# Patient Record
Sex: Male | Born: 1955
Health system: Southern US, Community
[De-identification: ages and names within clinical notes are randomized; demographics above are authoritative.]

## PROBLEM LIST (undated history)

## (undated) DIAGNOSIS — E871 Hypo-osmolality and hyponatremia: Secondary | ICD-10-CM

## (undated) DIAGNOSIS — J309 Allergic rhinitis, unspecified: Secondary | ICD-10-CM

## (undated) DIAGNOSIS — E7800 Pure hypercholesterolemia, unspecified: Secondary | ICD-10-CM

## (undated) DIAGNOSIS — E119 Type 2 diabetes mellitus without complications: Secondary | ICD-10-CM

## (undated) DIAGNOSIS — Z860100 Personal history of colon polyps, unspecified: Secondary | ICD-10-CM

## (undated) DIAGNOSIS — I10 Essential (primary) hypertension: Secondary | ICD-10-CM

## (undated) DIAGNOSIS — H409 Unspecified glaucoma: Secondary | ICD-10-CM

## (undated) DIAGNOSIS — K635 Polyp of colon: Secondary | ICD-10-CM

## (undated) DIAGNOSIS — C4491 Basal cell carcinoma of skin, unspecified: Secondary | ICD-10-CM

## (undated) DIAGNOSIS — Z9289 Personal history of other medical treatment: Secondary | ICD-10-CM

## (undated) DIAGNOSIS — R55 Syncope and collapse: Secondary | ICD-10-CM

## (undated) HISTORY — DX: Polyp of colon: K63.5

## (undated) HISTORY — DX: Allergic rhinitis, unspecified: J30.9

## (undated) HISTORY — DX: Personal history of colonic polyps: Z86.010

## (undated) HISTORY — PX: EYE SURGERY: SHX253

## (undated) HISTORY — PX: COLONOSCOPY: SHX174

## (undated) HISTORY — DX: Syncope and collapse: R55

## (undated) HISTORY — PX: TOTAL HIP ARTHROPLASTY: SHX124

## (undated) HISTORY — DX: Personal history of other medical treatment: Z92.89

## (undated) HISTORY — PX: ORIF WRIST FRACTURE: SHX2133

## (undated) HISTORY — DX: Personal history of colon polyps, unspecified: Z86.0100

## (undated) HISTORY — DX: Hypo-osmolality and hyponatremia: E87.1

## (undated) HISTORY — PX: HERNIA REPAIR: SHX51

---

## 1999-04-23 ENCOUNTER — Encounter: Admission: RE | Admit: 1999-04-23 | Discharge: 1999-07-22 | Payer: Self-pay | Admitting: Family Medicine

## 2003-05-11 ENCOUNTER — Encounter: Payer: Self-pay | Admitting: Orthopedic Surgery

## 2003-05-11 ENCOUNTER — Encounter: Admission: RE | Admit: 2003-05-11 | Discharge: 2003-05-11 | Payer: Self-pay | Admitting: Orthopedic Surgery

## 2004-10-08 ENCOUNTER — Encounter: Admission: RE | Admit: 2004-10-08 | Discharge: 2004-10-08 | Payer: Self-pay | Admitting: Orthopedic Surgery

## 2005-03-29 ENCOUNTER — Emergency Department (HOSPITAL_COMMUNITY): Admission: EM | Admit: 2005-03-29 | Discharge: 2005-03-29 | Payer: Self-pay | Admitting: Emergency Medicine

## 2005-09-13 ENCOUNTER — Emergency Department (HOSPITAL_COMMUNITY): Admission: EM | Admit: 2005-09-13 | Discharge: 2005-09-13 | Payer: Self-pay | Admitting: Emergency Medicine

## 2005-12-16 IMAGING — CR DG PELVIS 1-2V
1 series · 1 of 1 positions shown · non-contrast
Comparison: none

CLINICAL DATA: 48-year-old, left hip pain.  
 LEFT HIP ? 2 VIEW: 
 The left femoral prosthesis is dislocated superiorly.  There is marked lucency in the acetabulum and around the femoral prosthesis which may be secondary to particle disease.

[view not recorded]
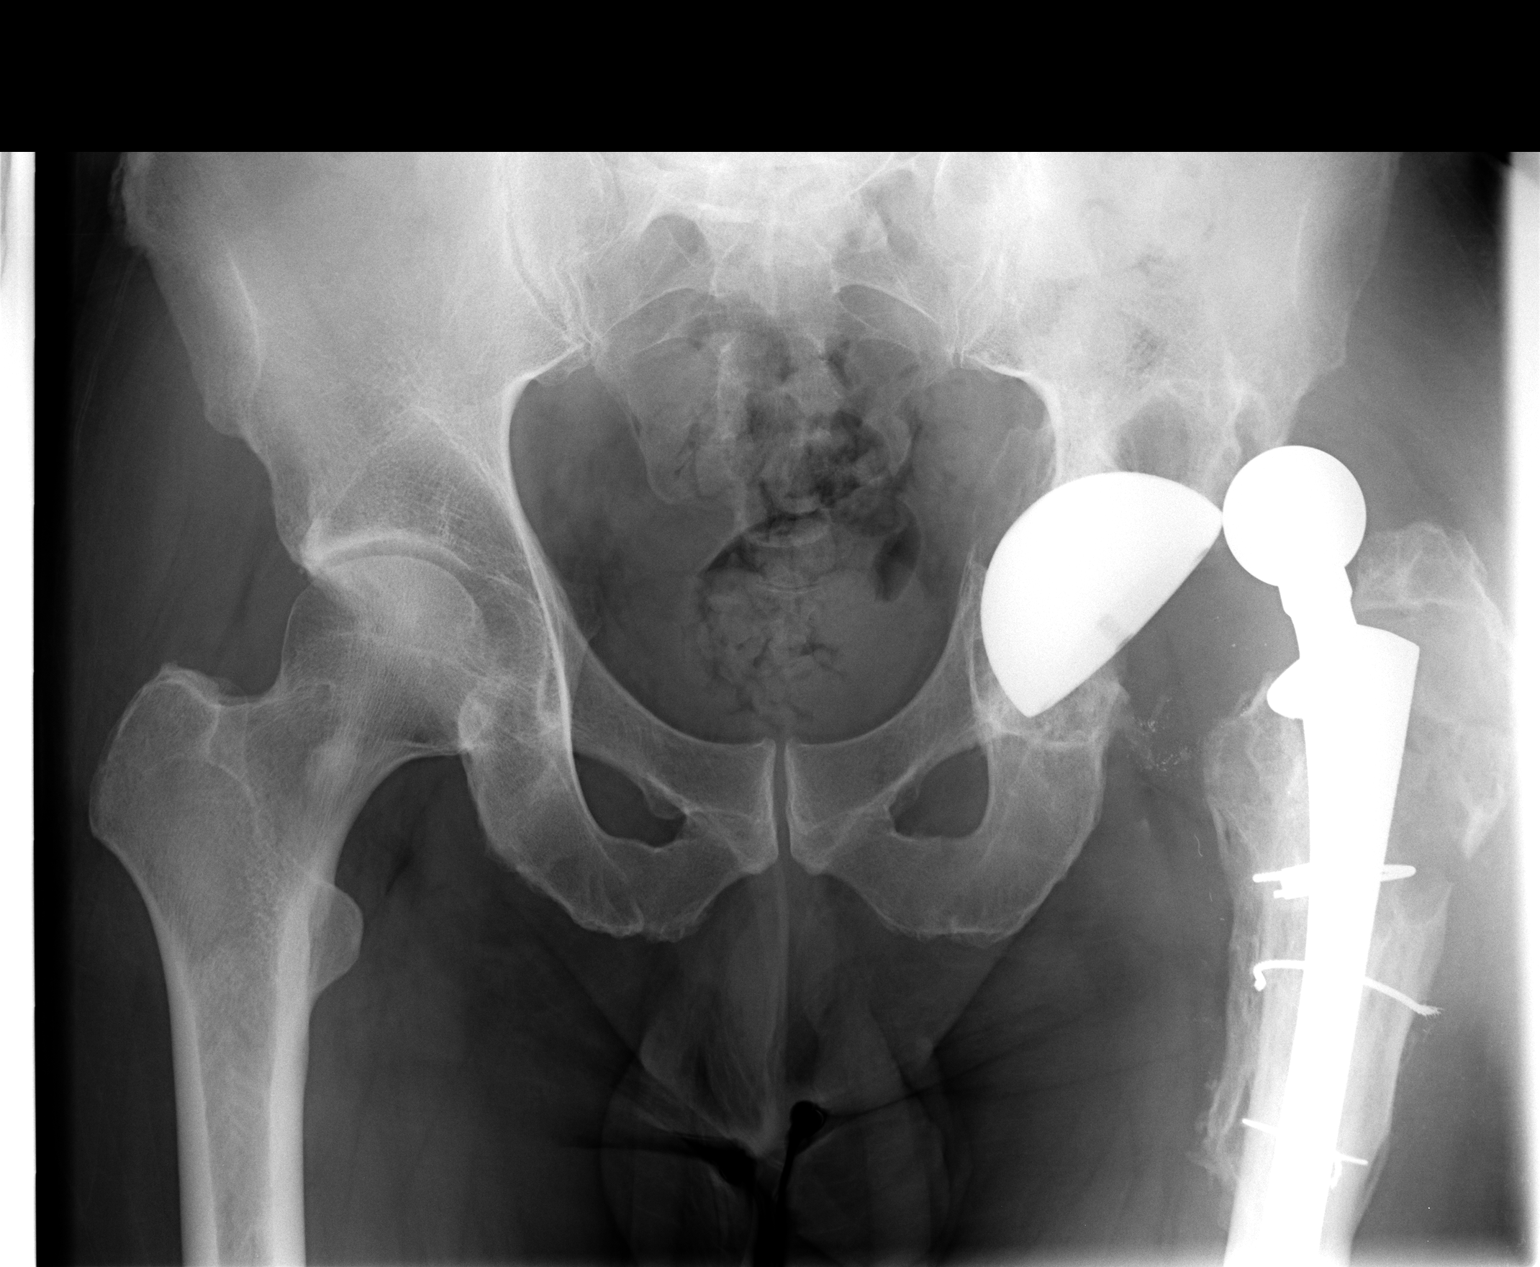

[1 of 1 positions shown; findings below may reference images not displayed]

IMPRESSION: Dislocated left femoral prosthesis. 
 AP PELVIS:
 Single AP view of the pelvis demonstrates dislocated left femoral prosthesis.  There is marked lucency involving the iliac bone around the acetabular prosthesis which may be due to advanced particle disease.  The femur looks similar in appearance.
IMPRESSION: Dislocated left femoral prosthesis.

## 2005-12-16 IMAGING — CR DG HIP (WITH OR WITHOUT PELVIS) 2-3V*L*
2 series · 2 of 2 positions shown · non-contrast
Comparison: none

CLINICAL DATA: 48-year-old, left hip pain.  
 LEFT HIP ? 2 VIEW: 
 The left femoral prosthesis is dislocated superiorly.  There is marked lucency in the acetabulum and around the femoral prosthesis which may be secondary to particle disease.

[view not recorded (1 of 2)]
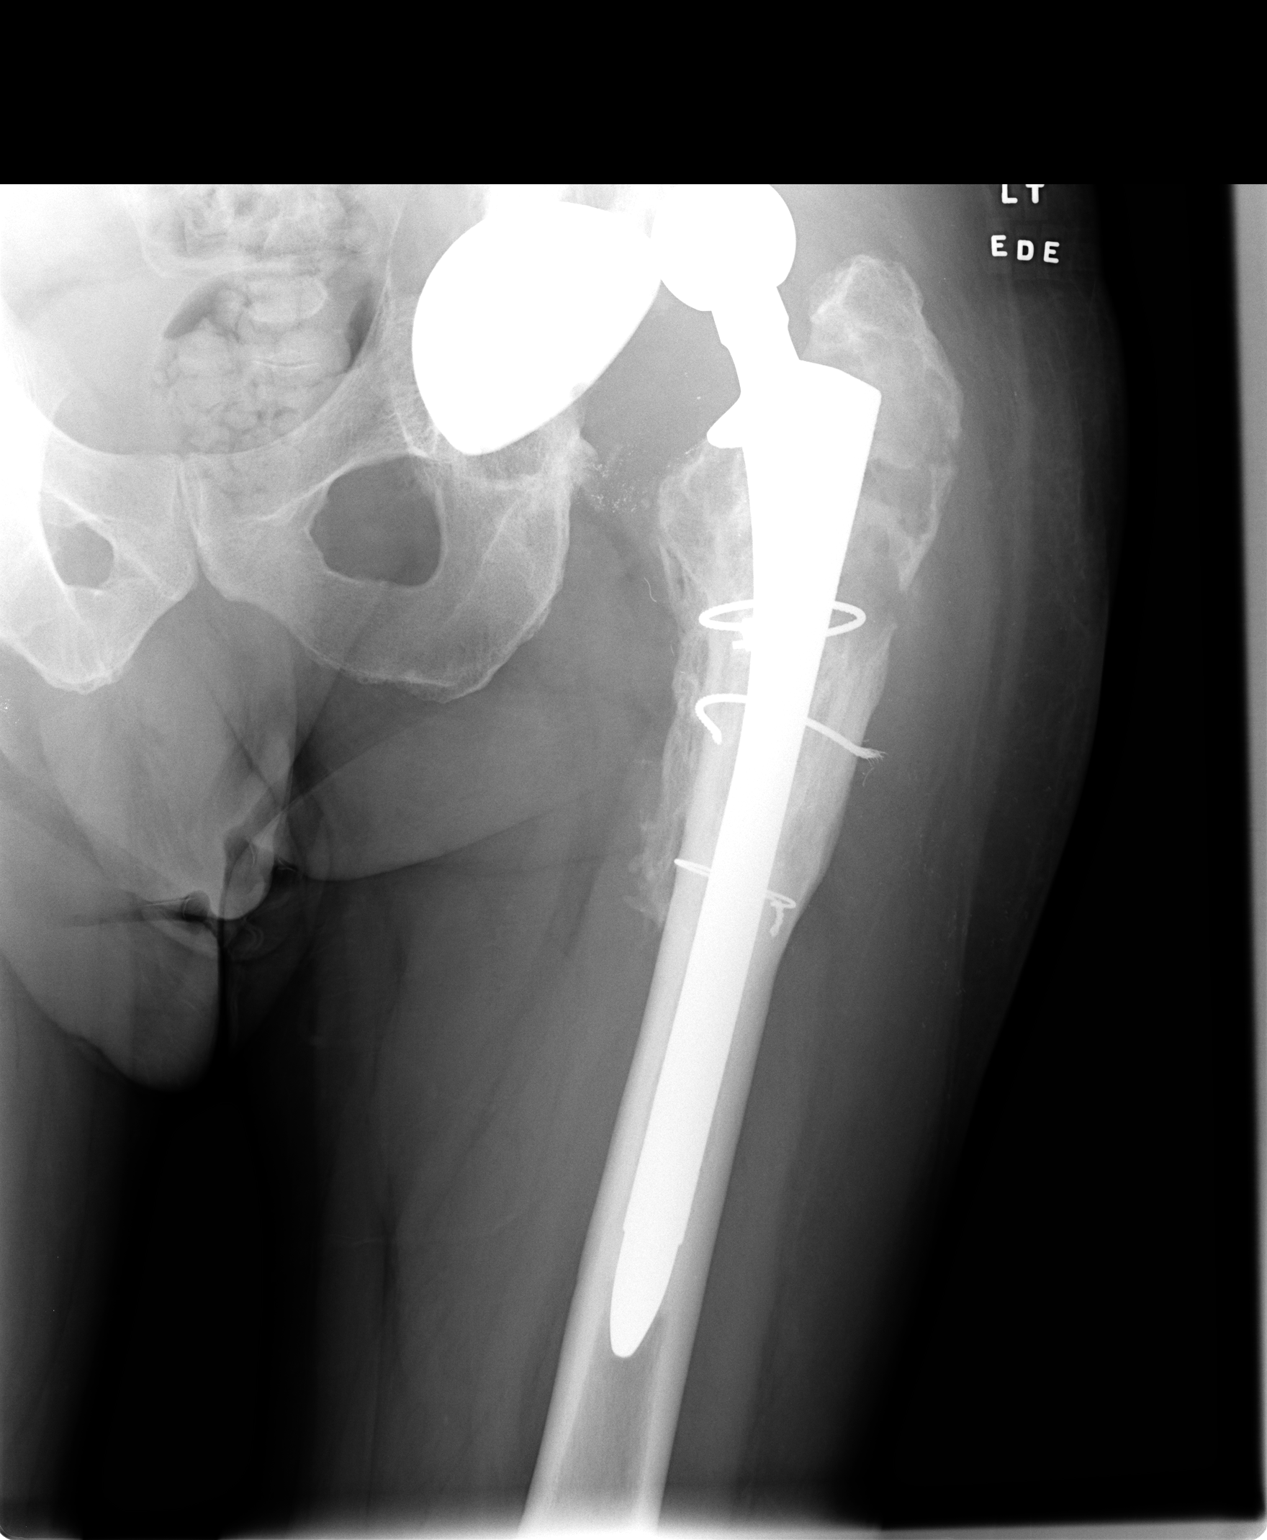

[view not recorded (2 of 2)]
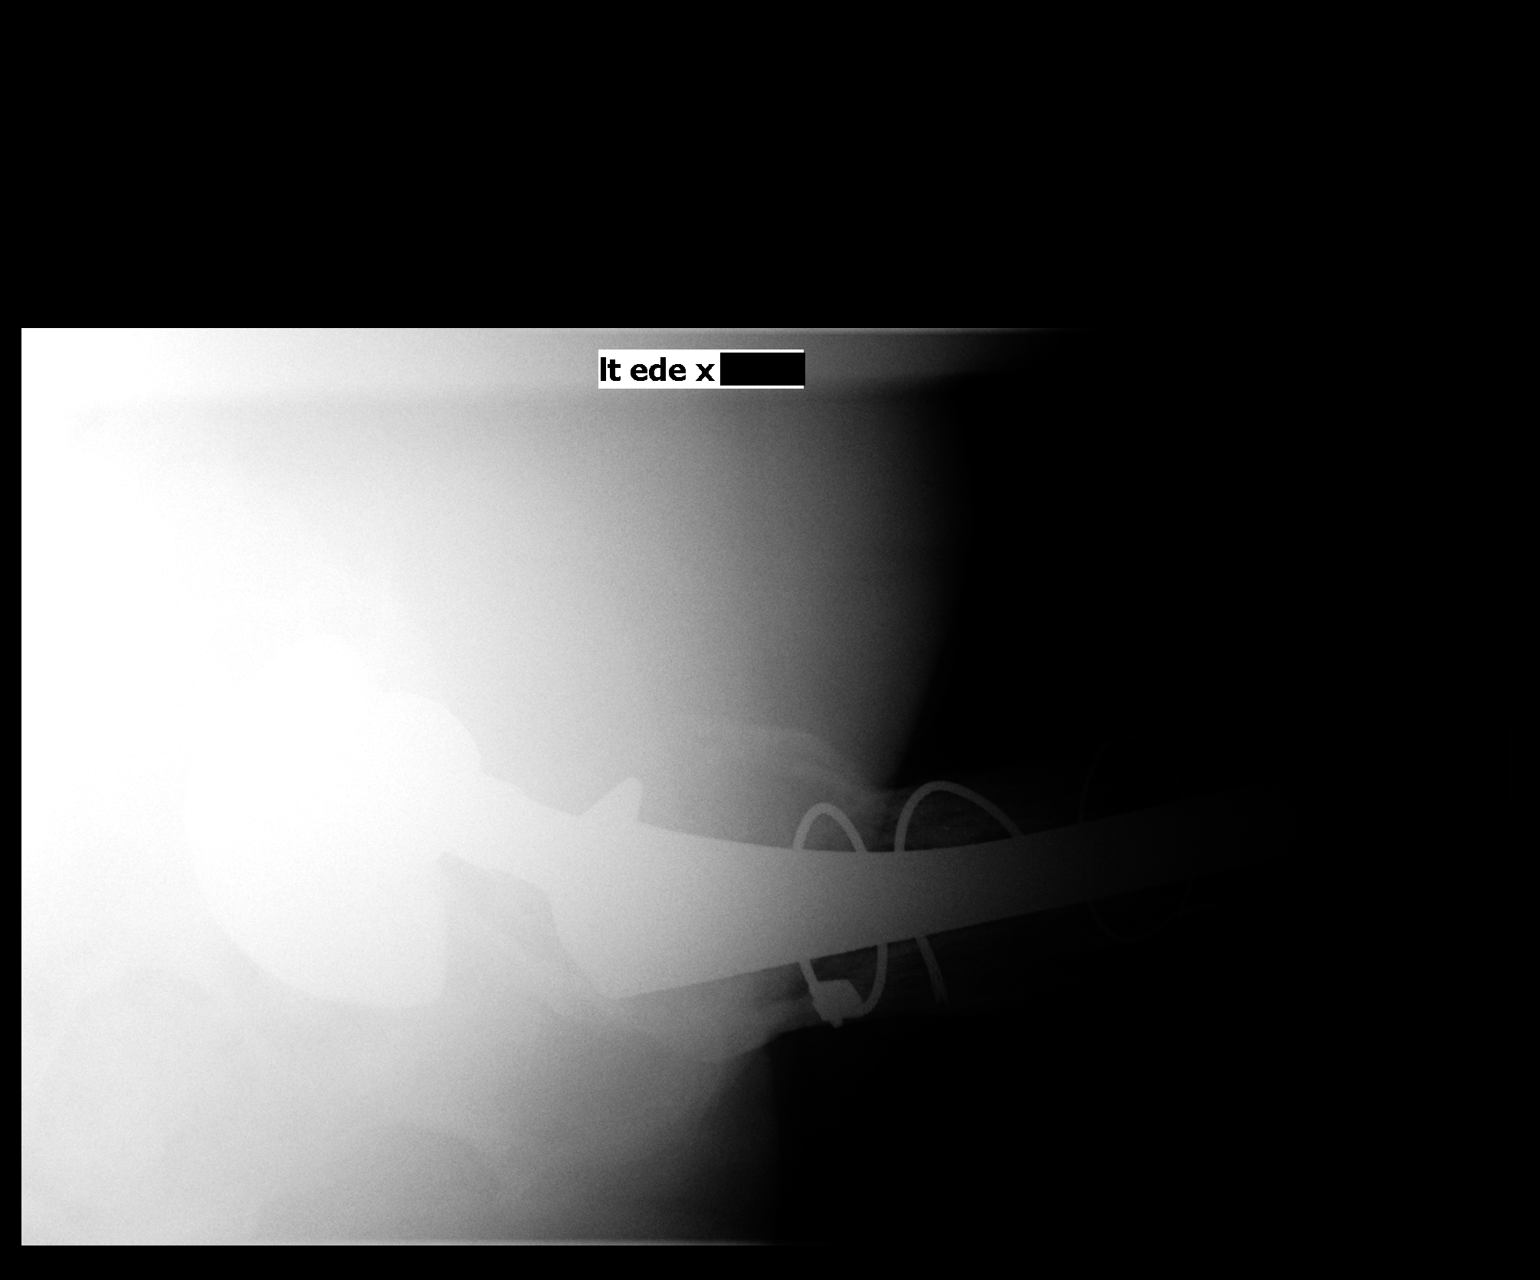

[2 of 2 positions shown; findings below may reference images not displayed]

IMPRESSION: Dislocated left femoral prosthesis. 
 AP PELVIS:
 Single AP view of the pelvis demonstrates dislocated left femoral prosthesis.  There is marked lucency involving the iliac bone around the acetabular prosthesis which may be due to advanced particle disease.  The femur looks similar in appearance.
IMPRESSION: Dislocated left femoral prosthesis.

## 2006-06-02 IMAGING — RF DG HIP OPERATIVE*L*
1 series · 1 of 1 positions shown · non-contrast
Comparison: Earlier today.

CLINICAL DATA: Closed reduction of a left hip dislocation.

LEFT HIP - SINGLE AP VIEW:

[Series 1: run · 1 of 1 slices shown]
[im 1/1]
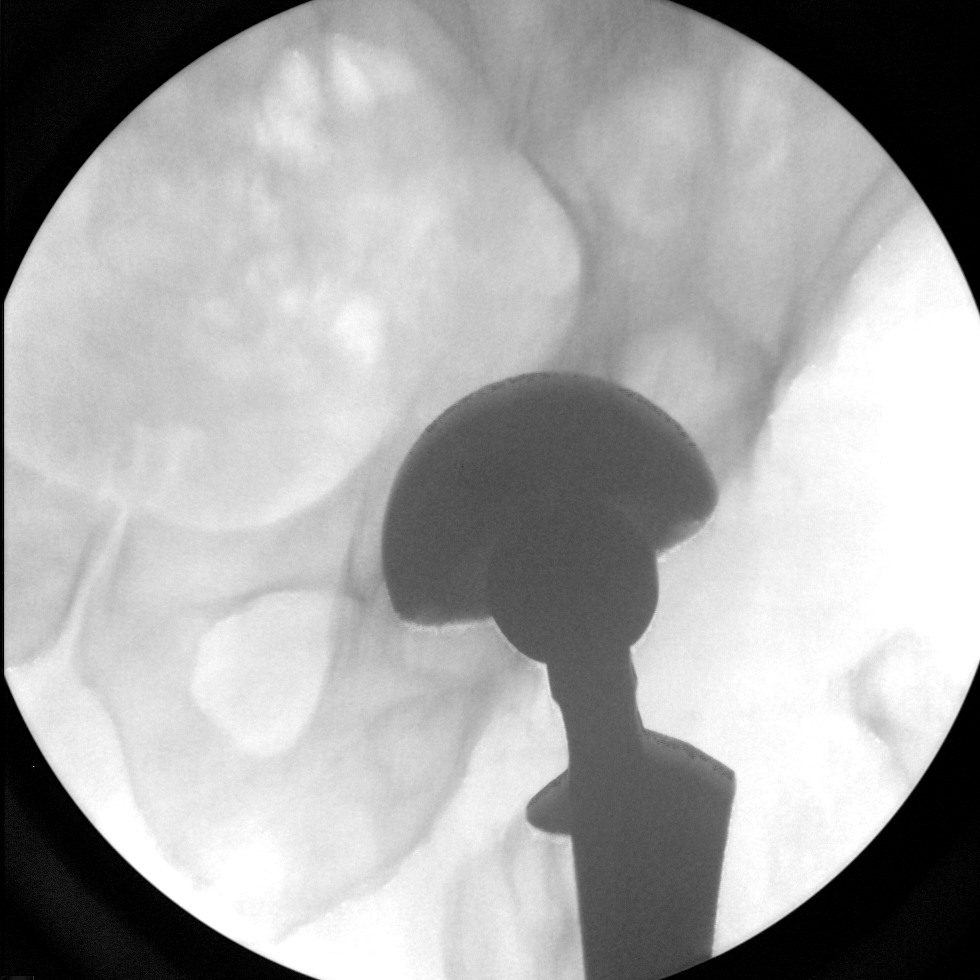

[1 of 1 positions shown; findings below may reference images not displayed]

FINDINGS: The previously demonstrated dislocated femoral head component of the
left total hip prosthesis has been relocated in the AP projection. Previously
noted periprosthetic lucency and acetabular discontinuity.
IMPRESSION: Relocated prosthesis. Stable findings of cortical disease and stable acetabular
discontinuity.

## 2013-08-11 ENCOUNTER — Other Ambulatory Visit: Payer: Self-pay | Admitting: Gastroenterology

## 2013-08-11 ENCOUNTER — Encounter (HOSPITAL_COMMUNITY)
Admission: RE | Disposition: A | Discharge status: Home / Self Care | Payer: Self-pay | Source: Ambulatory Visit | Attending: Gastroenterology

## 2013-08-11 ENCOUNTER — Ambulatory Visit (HOSPITAL_COMMUNITY)
Admission: RE | Admit: 2013-08-11 | Discharge: 2013-08-11 | Disposition: A | Discharge status: Home / Self Care | Payer: BC Managed Care – PPO | Source: Ambulatory Visit | Attending: Gastroenterology | Admitting: Gastroenterology

## 2013-08-11 ENCOUNTER — Encounter (HOSPITAL_COMMUNITY): Payer: Self-pay | Admitting: *Deleted

## 2013-08-11 DIAGNOSIS — D126 Benign neoplasm of colon, unspecified: Secondary | ICD-10-CM | POA: Diagnosis present

## 2013-08-11 DIAGNOSIS — K648 Other hemorrhoids: Secondary | ICD-10-CM | POA: Insufficient documentation

## 2013-08-11 DIAGNOSIS — D128 Benign neoplasm of rectum: Secondary | ICD-10-CM | POA: Insufficient documentation

## 2013-08-11 DIAGNOSIS — K635 Polyp of colon: Secondary | ICD-10-CM

## 2013-08-11 HISTORY — DX: Type 2 diabetes mellitus without complications: E11.9

## 2013-08-11 HISTORY — DX: Pure hypercholesterolemia, unspecified: E78.00

## 2013-08-11 HISTORY — PX: COLONOSCOPY: SHX5424

## 2013-08-11 HISTORY — PX: HOT HEMOSTASIS: SHX5433

## 2013-08-11 HISTORY — DX: Polyp of colon: K63.5

## 2013-08-11 HISTORY — DX: Essential (primary) hypertension: I10

## 2013-08-11 HISTORY — DX: Basal cell carcinoma of skin, unspecified: C44.91

## 2013-08-11 HISTORY — DX: Unspecified glaucoma: H40.9

## 2013-08-11 LAB — GLUCOSE, CAPILLARY: Glucose-Capillary: 109 mg/dL — ABNORMAL HIGH (ref 70–99)

## 2013-08-11 SURGERY — COLONOSCOPY
Anesthesia: Moderate Sedation

## 2013-08-11 MED ORDER — DIPHENHYDRAMINE HCL 50 MG/ML IJ SOLN
INTRAMUSCULAR | Status: DC | PRN
  Administered 2013-08-11: 25 mg via INTRAVENOUS

## 2013-08-11 MED ORDER — FENTANYL CITRATE 0.05 MG/ML IJ SOLN
INTRAMUSCULAR | Status: AC
  Filled 2013-08-11: qty 4

## 2013-08-11 MED ORDER — SODIUM CHLORIDE 0.9 % IV SOLN: INTRAVENOUS | Status: DC

## 2013-08-11 MED ORDER — FENTANYL CITRATE 0.05 MG/ML IJ SOLN
INTRAMUSCULAR | Status: DC | PRN
  Administered 2013-08-11 (×3): 25 ug via INTRAVENOUS

## 2013-08-11 MED ORDER — MIDAZOLAM HCL 5 MG/5ML IJ SOLN
INTRAMUSCULAR | Status: DC | PRN
  Administered 2013-08-11 (×4): 2.5 mg via INTRAVENOUS

## 2013-08-11 MED ORDER — SODIUM CHLORIDE 0.9 % IV SOLN
INTRAVENOUS | Status: DC
  Administered 2013-08-11: 500 mL via INTRAVENOUS

## 2013-08-11 MED ORDER — DIPHENHYDRAMINE HCL 50 MG/ML IJ SOLN
INTRAMUSCULAR | Status: AC
  Filled 2013-08-11: qty 1

## 2013-08-11 MED ORDER — MIDAZOLAM HCL 10 MG/2ML IJ SOLN
INTRAMUSCULAR | Status: AC
  Filled 2013-08-11: qty 4

## 2013-08-11 NOTE — Addendum Note (Signed)
Addended by: Charlott Rakes on: 08/11/2013 01:16 PM   Modules accepted: Orders

## 2013-08-11 NOTE — Op Note (Signed)
[facility] [address] [place], [address]   COLONOSCOPY PROCEDURE REPORT  PATIENT: Zachary Phelps, Zachary Phelps  MR#: [id number] BIRTHDATE: 11-27-1955 , 57  yrs. old GENDER: Male ENDOSCOPIST: Charlott Rakes, MD REFERRED BY: PROCEDURE DATE:  08/11/2013 PROCEDURE:   Colonoscopy with snare polypectomy ASA CLASS:   Class II INDICATIONS:follow up of adenomatous colonic polyp(s). MEDICATIONS: Fentanyl 100 mcg IV, Versed 10 mg IV, and Diphenhydramine (Benadryl) 25 mg IV  DESCRIPTION OF PROCEDURE:   After the risks benefits and alternatives of the procedure were thoroughly explained, informed consent was obtained.  The     endoscope was introduced through the anus and advanced to the cecum, which was identified by both the appendix and ileocecal valve , limited by No adverse events experienced.   The quality of the prep was good. .  The instrument was then slowly withdrawn as the colon was fully examined.     FINDINGS:  Rectal exam unremarkable.  Pediatric colonoscope inserted into the colon and advanced to the cecum, where the appendiceal orifice and ileocecal valve were identified.    On careful withdrawal of the colonoscope the previously tattooed area was noted in the ascending colon and residual polyp was seen. Polyp was removed with snare cautery and no bleeding seen at the polypectomy site. Due to the size of the polypectomy site 2 hemoclips were used to close the mucosal folds together at the polypectomy site. Four polyps were seen in the sigmoid colon, rectosigmoid colon, and rectum and they were removed with cold biopsy forceps. A 4 mm sessile polyp was removed with snare cautery in the rectosigmoid area but was not able to be retrieved. Retroflexion revealed small internal hemorrhoids.  COMPLICATIONS: None  IMPRESSION:     1. Large ascending colon polyp removed at previously tattooed site consistent with polyp that was noted to be a serrated adenoma on  previous procedure 2. Four colon polyps distally removed as above 3. Small polyp removed in rectosigmoid area that could not be retrieved 4. Small internal hemorrhoids  RECOMMENDATIONS: F/U on path; No aspirin products for 2 weeks    ______________________________ eSignedCharlott Rakes, MD 08/11/2013 2:44 PM   CC:  PATIENT NAME:  Rider, Ermis MR#: [id number]

## 2013-08-11 NOTE — H&P (Signed)
  Date of Initial H&P: 08/06/13  History reviewed, patient examined, no change in status, stable for surgery.

## 2013-08-11 NOTE — Interval H&P Note (Signed)
History and Physical Interval Note:  08/11/2013 1:50 PM  Zachary Phelps  has presented today for surgery, with the diagnosis of polyp  The various methods of treatment have been discussed with the patient and family. After consideration of risks, benefits and other options for treatment, the patient has consented to  Procedure(s): COLONOSCOPY (N/A) HOT HEMOSTASIS (ARGON PLASMA COAGULATION/BICAP) (N/A) as a surgical intervention .  The patient's history has been reviewed, patient examined, no change in status, stable for surgery.  I have reviewed the patient's chart and labs.  Questions were answered to the patient's satisfaction.     Iasia Forcier C.

## 2013-08-12 ENCOUNTER — Encounter (HOSPITAL_COMMUNITY): Payer: Self-pay | Admitting: Gastroenterology

## 2017-01-05 ENCOUNTER — Emergency Department (HOSPITAL_COMMUNITY): Payer: No Typology Code available for payment source

## 2017-01-05 ENCOUNTER — Emergency Department (HOSPITAL_COMMUNITY)
Admission: EM | Admit: 2017-01-05 | Discharge: 2017-01-05 | Disposition: A | Discharge status: Home / Self Care | Payer: No Typology Code available for payment source | Attending: Emergency Medicine | Admitting: Emergency Medicine

## 2017-01-05 DIAGNOSIS — Y92410 Unspecified street and highway as the place of occurrence of the external cause: Secondary | ICD-10-CM | POA: Diagnosis not present

## 2017-01-05 DIAGNOSIS — Z85828 Personal history of other malignant neoplasm of skin: Secondary | ICD-10-CM | POA: Diagnosis not present

## 2017-01-05 DIAGNOSIS — I10 Essential (primary) hypertension: Secondary | ICD-10-CM | POA: Diagnosis not present

## 2017-01-05 DIAGNOSIS — Y999 Unspecified external cause status: Secondary | ICD-10-CM | POA: Insufficient documentation

## 2017-01-05 DIAGNOSIS — E119 Type 2 diabetes mellitus without complications: Secondary | ICD-10-CM | POA: Diagnosis not present

## 2017-01-05 DIAGNOSIS — S199XXA Unspecified injury of neck, initial encounter: Secondary | ICD-10-CM | POA: Diagnosis present

## 2017-01-05 DIAGNOSIS — Z85038 Personal history of other malignant neoplasm of large intestine: Secondary | ICD-10-CM | POA: Insufficient documentation

## 2017-01-05 DIAGNOSIS — S161XXA Strain of muscle, fascia and tendon at neck level, initial encounter: Secondary | ICD-10-CM

## 2017-01-05 DIAGNOSIS — Y939 Activity, unspecified: Secondary | ICD-10-CM | POA: Diagnosis not present

## 2017-01-05 DIAGNOSIS — Z79899 Other long term (current) drug therapy: Secondary | ICD-10-CM | POA: Insufficient documentation

## 2017-01-05 DIAGNOSIS — Z7982 Long term (current) use of aspirin: Secondary | ICD-10-CM | POA: Insufficient documentation

## 2017-01-05 DIAGNOSIS — Z96642 Presence of left artificial hip joint: Secondary | ICD-10-CM | POA: Diagnosis not present

## 2017-01-05 MED ORDER — METHOCARBAMOL 750 MG PO TABS: 750.0000 mg | ORAL_TABLET | Freq: Four times a day (QID) | ORAL | 0 refills | Status: DC

## 2017-01-05 MED ORDER — ONDANSETRON 4 MG PO TBDP
4.0000 mg | ORAL_TABLET | Freq: Once | ORAL | Status: AC
  Administered 2017-01-05: 4 mg via ORAL
  Filled 2017-01-05: qty 1

## 2017-01-05 MED ORDER — ACETAMINOPHEN 325 MG PO TABS
650.0000 mg | ORAL_TABLET | Freq: Once | ORAL | Status: AC
  Administered 2017-01-05: 650 mg via ORAL
  Filled 2017-01-05: qty 2
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
## 2017-01-05 NOTE — ED Notes (Signed)
ED Provider at bedside. 

## 2017-06-12 ENCOUNTER — Observation Stay (HOSPITAL_BASED_OUTPATIENT_CLINIC_OR_DEPARTMENT_OTHER): Payer: Self-pay

## 2017-06-12 ENCOUNTER — Encounter (HOSPITAL_COMMUNITY): Payer: Self-pay

## 2017-06-12 ENCOUNTER — Observation Stay (HOSPITAL_COMMUNITY)
Admission: EM | Admit: 2017-06-12 | Discharge: 2017-06-13 | Disposition: A | Discharge status: Home / Self Care | Payer: Self-pay | Attending: Internal Medicine | Admitting: Internal Medicine

## 2017-06-12 DIAGNOSIS — E119 Type 2 diabetes mellitus without complications: Secondary | ICD-10-CM | POA: Insufficient documentation

## 2017-06-12 DIAGNOSIS — R531 Weakness: Secondary | ICD-10-CM

## 2017-06-12 DIAGNOSIS — E871 Hypo-osmolality and hyponatremia: Secondary | ICD-10-CM | POA: Insufficient documentation

## 2017-06-12 DIAGNOSIS — I10 Essential (primary) hypertension: Secondary | ICD-10-CM | POA: Diagnosis present

## 2017-06-12 DIAGNOSIS — R55 Syncope and collapse: Principal | ICD-10-CM | POA: Diagnosis present

## 2017-06-12 DIAGNOSIS — H409 Unspecified glaucoma: Secondary | ICD-10-CM | POA: Diagnosis present

## 2017-06-12 DIAGNOSIS — Z7982 Long term (current) use of aspirin: Secondary | ICD-10-CM | POA: Insufficient documentation

## 2017-06-12 DIAGNOSIS — Z794 Long term (current) use of insulin: Secondary | ICD-10-CM | POA: Insufficient documentation

## 2017-06-12 DIAGNOSIS — Z79899 Other long term (current) drug therapy: Secondary | ICD-10-CM | POA: Insufficient documentation

## 2017-06-12 DIAGNOSIS — M791 Myalgia: Secondary | ICD-10-CM | POA: Insufficient documentation

## 2017-06-12 DIAGNOSIS — E7800 Pure hypercholesterolemia, unspecified: Secondary | ICD-10-CM | POA: Insufficient documentation

## 2017-06-12 DIAGNOSIS — Z85828 Personal history of other malignant neoplasm of skin: Secondary | ICD-10-CM | POA: Insufficient documentation

## 2017-06-12 HISTORY — DX: Syncope and collapse: R55

## 2017-06-12 LAB — URINALYSIS, ROUTINE W REFLEX MICROSCOPIC
BILIRUBIN URINE: NEGATIVE
Glucose, UA: >=500 mg/dL — AB
Hgb urine dipstick: NEGATIVE
KETONES UR: 20 mg/dL — AB
Leukocytes, UA: NEGATIVE
Nitrite: NEGATIVE
PROTEIN: 100 mg/dL — AB
SQUAMOUS EPITHELIAL / LPF: NONE SEEN
Specific Gravity, Urine: 1.024 (ref 1.005–1.030)
pH: 5 (ref 5.0–8.0)

## 2017-06-12 LAB — BASIC METABOLIC PANEL
Anion gap: 10 (ref 5–15)
BUN: 12 mg/dL (ref 6–20)
CO2: 25 mmol/L (ref 22–32)
CREATININE: 0.68 mg/dL (ref 0.61–1.24)
Calcium: 9.1 mg/dL (ref 8.9–10.3)
Chloride: 96 mmol/L — ABNORMAL LOW (ref 101–111)
GFR calc Af Amer: >60 mL/min (ref >60)
GFR calc non Af Amer: >60 mL/min (ref >60)
Glucose, Bld: 218 mg/dL — ABNORMAL HIGH (ref 65–99)
Potassium: 3.8 mmol/L (ref 3.5–5.1)
Sodium: 131 mmol/L — ABNORMAL LOW (ref 135–145)

## 2017-06-12 LAB — MAGNESIUM: Magnesium: 1.5 mg/dL — ABNORMAL LOW (ref 1.7–2.4)

## 2017-06-12 LAB — CBC
HCT: 39.7 % (ref 39.0–52.0)
Hemoglobin: 14 g/dL (ref 13.0–17.0)
MCH: 30.4 pg (ref 26.0–34.0)
MCHC: 35.3 g/dL (ref 30.0–36.0)
MCV: 86.1 fL (ref 78.0–100.0)
PLATELETS: 204 10*3/uL (ref 150–400)
RBC: 4.61 MIL/uL (ref 4.22–5.81)
RDW: 13.2 % (ref 11.5–15.5)
WBC: 10.1 10*3/uL (ref 4.0–10.5)

## 2017-06-12 LAB — GLUCOSE, CAPILLARY
GLUCOSE-CAPILLARY: 216 mg/dL — AB (ref 65–99)
Glucose-Capillary: 174 mg/dL — ABNORMAL HIGH (ref 65–99)

## 2017-06-12 LAB — TROPONIN I

## 2017-06-12 LAB — ECHOCARDIOGRAM COMPLETE
HEIGHTINCHES: 70 in
WEIGHTICAEL: 2960 [oz_av]

## 2017-06-12 LAB — PHOSPHORUS: PHOSPHORUS: 2.6 mg/dL (ref 2.5–4.6)

## 2017-06-12 MED ORDER — TIMOLOL MALEATE 0.5 % OP SOLN
1.0000 [drp] | Freq: Two times a day (BID) | OPHTHALMIC | Status: DC
  Administered 2017-06-12 – 2017-06-13 (×2): 1 [drp] via OPHTHALMIC
  Filled 2017-06-12: qty 5

## 2017-06-12 MED ORDER — BRIMONIDINE TARTRATE 0.2 % OP SOLN
1.0000 [drp] | Freq: Two times a day (BID) | OPHTHALMIC | Status: DC
  Administered 2017-06-12 – 2017-06-13 (×2): 1 [drp] via OPHTHALMIC
  Filled 2017-06-12: qty 5

## 2017-06-12 MED ORDER — SODIUM CHLORIDE 0.9 % IV SOLN
INTRAVENOUS | Status: AC
  Administered 2017-06-12 – 2017-06-13 (×2): via INTRAVENOUS

## 2017-06-12 MED ORDER — HYDROCHLOROTHIAZIDE 25 MG PO TABS
25.0000 mg | ORAL_TABLET | Freq: Every day | ORAL | Status: DC
  Administered 2017-06-12 – 2017-06-13 (×2): 25 mg via ORAL
  Filled 2017-06-12 (×2): qty 1

## 2017-06-12 MED ORDER — SODIUM CHLORIDE 0.9% FLUSH
3.0000 mL | Freq: Two times a day (BID) | INTRAVENOUS | Status: DC
  Administered 2017-06-13: 3 mL via INTRAVENOUS

## 2017-06-12 MED ORDER — INSULIN ASPART 100 UNIT/ML ~~LOC~~ SOLN
0.0000 [IU] | Freq: Three times a day (TID) | SUBCUTANEOUS | Status: DC
  Administered 2017-06-12: 18:00:00 5 [IU] via SUBCUTANEOUS
  Administered 2017-06-13: 10:00:00 3 [IU] via SUBCUTANEOUS

## 2017-06-12 MED ORDER — ACETAMINOPHEN 325 MG PO TABS: 650.0000 mg | ORAL_TABLET | Freq: Four times a day (QID) | ORAL | Status: DC | PRN

## 2017-06-12 MED ORDER — HYDRALAZINE HCL 20 MG/ML IJ SOLN: 10.0000 mg | Freq: Three times a day (TID) | INTRAMUSCULAR | Status: DC | PRN

## 2017-06-12 MED ORDER — AMLODIPINE BESYLATE 2.5 MG PO TABS
2.5000 mg | ORAL_TABLET | Freq: Every day | ORAL | Status: DC
  Administered 2017-06-13: 2.5 mg via ORAL
  Filled 2017-06-12: qty 1

## 2017-06-12 MED ORDER — BRIMONIDINE TARTRATE-TIMOLOL 0.2-0.5 % OP SOLN
1.0000 [drp] | Freq: Two times a day (BID) | OPHTHALMIC | Status: DC
  Filled 2017-06-12: qty 5

## 2017-06-12 MED ORDER — ASPIRIN EC 81 MG PO TBEC
81.0000 mg | DELAYED_RELEASE_TABLET | Freq: Every day | ORAL | Status: DC
  Administered 2017-06-12 – 2017-06-13 (×2): 81 mg via ORAL
  Filled 2017-06-12 (×2): qty 1

## 2017-06-12 MED ORDER — SODIUM CHLORIDE 0.9 % IV BOLUS (SEPSIS)
500.0000 mL | Freq: Once | INTRAVENOUS | Status: AC
  Administered 2017-06-12: 500 mL via INTRAVENOUS

## 2017-06-12 MED ORDER — LISINOPRIL 20 MG PO TABS
20.0000 mg | ORAL_TABLET | Freq: Every day | ORAL | Status: DC
  Administered 2017-06-12 – 2017-06-13 (×2): 20 mg via ORAL
  Filled 2017-06-12 (×2): qty 1

## 2017-06-12 MED ORDER — ATORVASTATIN CALCIUM 10 MG PO TABS
10.0000 mg | ORAL_TABLET | ORAL | Status: DC
  Administered 2017-06-13: 10 mg via ORAL
  Filled 2017-06-12: qty 1

## 2017-06-12 MED ORDER — LISINOPRIL-HYDROCHLOROTHIAZIDE 20-25 MG PO TABS: 1.0000 | ORAL_TABLET | Freq: Every day | ORAL | Status: DC

## 2017-06-12 MED ORDER — LATANOPROST 0.005 % OP SOLN
1.0000 [drp] | Freq: Every day | OPHTHALMIC | Status: DC
  Administered 2017-06-12: 1 [drp] via OPHTHALMIC
  Filled 2017-06-12: qty 2.5

## 2017-06-12 MED ORDER — ONDANSETRON HCL 4 MG/2ML IJ SOLN: 4.0000 mg | Freq: Four times a day (QID) | INTRAMUSCULAR | Status: DC | PRN

## 2017-06-12 NOTE — ED Provider Notes (Signed)
[facility] DEPT Provider Note   CSN: [id number] Arrival date & time: 06/12/17  0904   History   Chief Complaint Chief Complaint  Patient presents with  . Loss of Consciousness    HPI Zachary Phelps is a 61 y.o. male.  Presented with acute onset syncope while sitting in a barber chair today. Patient has a PMH significant for HTN, DMII, HLP, colonic polyps s/p removal, hernia repair and hip replacement as well as chronic lower back pain. He awoke this morning as usual, did not feel different than any other day. Had coffee with a little cream and went to the barber. There, while sitting in the chair he began to feel warm, started sweating and a" little unusual". The next thing he recalls, the barber is telling him that he called EMS as he was passed out for a few brief moments, patient is unaware of the exact length of time he was uncounsious. He states that since that time he feels weak, but denied additional symptoms such as SOB, chest pain, back pain, headache, visual disturbances etc. He denied muscle tremor or shaking, loss of bowel or bladder control, cardiac palpitations, pain, positional changes. He stated that he was sitting upright in the chair, neck was in a neutral position, not strained or rotated to one side or the other.       Past Medical History:  Diagnosis Date  . Basal cell carcinoma   . Diabetes mellitus without complication ([place])   . Glaucoma   . Hypercholesteremia   . Hypertension     Patient Active Problem List   Diagnosis Date Noted  . Benign neoplasm of colon 08/11/2013    Past Surgical History:  Procedure Laterality Date  . COLONOSCOPY    . COLONOSCOPY N/A 08/11/2013   Procedure: COLONOSCOPY;  Surgeon: Lear Ng, MD;  Location: [facility];  Service: Endoscopy;  Laterality: N/A;  . EYE SURGERY    . HERNIA REPAIR     inguinal  . HOT HEMOSTASIS N/A 08/11/2013   Procedure: HOT HEMOSTASIS (ARGON PLASMA COAGULATION/BICAP);  Surgeon: Lear Ng, MD;  Location: [facility];  Service: Endoscopy;  Laterality: N/A;  . JOINT REPLACEMENT     lt hip  . ORIF WRIST FRACTURE         Home Medications    Prior to Admission medications   Medication Sig Start Date End Date Taking? Authorizing Provider  amLODipine (NORVASC) 2.5 MG tablet Take 2.5 mg by mouth daily.    [provider]  aspirin 81 MG tablet Take 81 mg by mouth daily.    [provider]  atorvastatin (LIPITOR) 10 MG tablet Take 10 mg by mouth every other day.     [provider]  bimatoprost (LUMIGAN) 0.03 % ophthalmic solution Place 1 drop into both eyes at bedtime.    [provider]  brimonidine-timolol (COMBIGAN) 0.2-0.5 % ophthalmic solution Place 1 drop into both eyes every 12 (twelve) hours.    [provider]  lisinopril-hydrochlorothiazide (PRINZIDE,ZESTORETIC) 20-25 MG per tablet Take 1 tablet by mouth daily.    [provider]  methocarbamol (ROBAXIN-750) 750 MG tablet Take 1 tablet (750 mg total) by mouth 4 (four) times daily. 01/05/17   Lacretia Leigh, MD  Omega-3 Fatty Acids (FISH OIL) 1000 MG CAPS Take 1,000 mg by mouth daily.     [provider]  pioglitazone (ACTOS) 30 MG tablet Take 30 mg by mouth daily.    [provider]  pioglitazone-metformin (ACTOPLUS MET)  15-850 MG per tablet Take 1 tablet by mouth 2 (two) times daily with a meal.    [provider]    Family History No family history on file.  Social History Social History  Substance Use Topics  . Smoking status: Never Smoker  . Smokeless tobacco: Never Used  . Alcohol use No     Allergies   Patient has no known allergies.   Review of Systems Review of Systems  Constitutional: Positive for activity change and diaphoresis. Negative for chills and fever.  HENT: Negative for congestion, rhinorrhea, sinus pain, sinus pressure and sore throat.   Eyes: Negative for photophobia, pain and visual  disturbance.  Respiratory: Negative for cough, chest tightness and shortness of breath.   Cardiovascular: Negative for chest pain and palpitations.  Gastrointestinal: Positive for nausea. Negative for abdominal distention, abdominal pain, diarrhea and vomiting.  Genitourinary: Negative for frequency and urgency.  Musculoskeletal: Positive for back pain. Negative for neck pain and neck stiffness.  Skin: Negative for wound.  Neurological: Positive for syncope and weakness. Negative for dizziness, seizures and light-headedness.  Psychiatric/Behavioral: Negative for confusion. The patient is not nervous/anxious.   All other systems reviewed and are negative.    Physical Exam Updated Vital Signs BP (!) 143/82 (BP Location: Right Arm)   Pulse 68   Temp 97.8 F (36.6 C) (Oral)   Resp 14   Ht '5\' 10"'  (1.778 m)   Wt 83.9 kg (185 lb)   SpO2 100%   BMI 26.54 kg/m   Physical Exam  Constitutional: He is oriented to person, place, and time. He appears well-developed and well-nourished. No distress.  HENT:  Head: Normocephalic and atraumatic.  Eyes: Conjunctivae and EOM are normal. Right eye exhibits no discharge. Left eye exhibits no discharge. No scleral icterus.  Patient utilized multiple ophthalmic solutions that restrict pupillary reactions. However, Pupils appear round, minimally reactive to light bilaterally  Neck: Normal range of motion. Neck supple. No JVD present. No tracheal deviation present.  Cardiovascular: Normal rate, regular rhythm and intact distal pulses.  Exam reveals no gallop and no friction rub.   No murmur heard. Pulmonary/Chest: Effort normal and breath sounds normal. No respiratory distress. He has no wheezes. He exhibits no tenderness.  Abdominal: Soft. Bowel sounds are normal. He exhibits no distension. There is no tenderness. There is no guarding.  Musculoskeletal: Normal range of motion. He exhibits no edema, tenderness or deformity.  Neurological: He is alert and  oriented to person, place, and time. No cranial nerve deficit or sensory deficit. He exhibits normal muscle tone.  Skin: Skin is warm and dry. He is not diaphoretic. No erythema. No pallor.  Psychiatric: He has a normal mood and affect. His behavior is normal. Judgment and thought content normal.  Nursing note and vitals reviewed.    ED Treatments / Results  Labs (all labs ordered are listed, but only abnormal results are displayed) Labs Reviewed  BASIC METABOLIC PANEL  CBC  URINALYSIS, ROUTINE W REFLEX MICROSCOPIC  CBG MONITORING, ED    EKG  EKG Interpretation  Date/Time:  Thursday June 12 2017 09:12:23 EDT Ventricular Rate:  70 PR Interval:    QRS Duration: 94 QT Interval:  420 QTC Calculation: 454 R Axis:   72 Text Interpretation:  Sinus rhythm Prolonged PR interval Minimal ST elevation, anterior leads No significant change since last tracing Confirmed by Theotis Burrow [phone]) on 06/12/2017 9:25:35 AM       Radiology No results found.  Procedures Procedures (including  critical care time)  Medications Ordered in ED Medications - No data to display   Initial Impression / Assessment and Plan / ED Course  I have reviewed the triage vital signs and the nursing notes.  Pertinent labs & imaging results that were available during my care of the patient were reviewed by me and considered in my medical decision making (see chart for details).    Patient presented with acute onset syncope. Denied previous occurrence of such with the exception of following a surgery when he felt warm started to sweat as today and then passed out in bed. Given his presentation and evaluation thus far he is most likely experiencing vasovagal syncope, however, concern for life threatening arrhythmia is present. Will monitor and test for electrolyte abnormalities and cardiac arrhythmia, as well as more concerning causes such as MI and PE if symptoms indicate such a presentation. Given that his O2  saturation is near 100%, he denied SOB, chest pain PE and MI seem unlikely. CBG was 158 as well and given that the patient is not on insulin hypoglycemia induced syncope seems unlikely.  10:55 Troponin I negative at 3 hours past initial event. Would need to repeat at 1300 to rule out MI. CBC unremarkable, BMP demonstrated hyponatremia of 175mq and hyperglycemia of 218. EKG was unremarkable, patient in normal sinus rhythm at the time of evaluation without ST alteration.  UA demonstrated glucose, protein. Ketones noted which is consistent with a state of fasting.  11:40 AM, Placed consult to Hospitalist for observation of his cardiac status.   Final Clinical Impressions(s) / ED Diagnoses   Final diagnoses:  None    New Prescriptions New Prescriptions   No medications on file     HKathi Ludwig MD 06/14/17 [address] RWenda Overland MD 06/19/17 2136

## 2017-06-12 NOTE — ED Notes (Signed)
[facility] stated lunch tray should be here in approximately 10 mins.

## 2017-06-12 NOTE — ED Notes (Signed)
Walked patient to the  Bathroom patient did fine

## 2017-06-12 NOTE — Progress Notes (Signed)
  Echocardiogram 2D Echocardiogram has been performed.  Zachary Phelps 06/12/2017, 3:39 PM

## 2017-06-12 NOTE — ED Triage Notes (Signed)
Pt brought in by EMS due to having syncopal episode. Per EMS, pt had syncopal episode that lasted approximately 2 minutes. Pt denies falling, hitting head, or CP. Pt endorse nausea and sweating. Pt a&ox4.

## 2017-06-12 NOTE — H&P (Signed)
[facility] History and Physical  Zachary Phelps ZOX:[id number] DOB: 1955-11-22 DOA: 06/12/2017  Referring physician:   PCP: Antony Contras, MD   Chief Complaint:   HPI: Zachary Phelps is a 61 y.o. male  with past medical history of basal cell carcinoma, diabetes, glaucoma, elevated cholesterol and hypertension presented to the emergency room with syncope. Patient states that he is been in his normal state of health over the last several days. No new medications over-the-counter or prescribed. No symptoms of illness. Denies chest pain shortness of breath dizziness headache nausea vomiting diarrhea. Patient states this morning he woke up and had a couple coffee and then went to his barber to get a haircut. Patient states while in the chair he began to feel very warm and the next thing he knew his barber when he opened his eyes said he had called EMS. Patient states that his barber said he was unconscious for maximum 2 minutes. Patient was transferred to the hospital by EMS and patient is unsure what medications were given on the truck.  Patient did not wet himself or bite his tongue.  ED course: . EDP did not check CT head. Initial troponin negative. No STEMI on EKG. Given a bolus of saline. Orthostatics were negative per EDP. Hospitalist consulted for admission.  Review of Systems:  As per HPI otherwise 10 point review of systems negative.    Past Medical History:  Diagnosis Date  . Basal cell carcinoma   . Diabetes mellitus without complication ([place])   . Glaucoma   . Hypercholesteremia   . Hypertension    Past Surgical History:  Procedure Laterality Date  . COLONOSCOPY    . COLONOSCOPY N/A 08/11/2013   Procedure: COLONOSCOPY;  Surgeon: Lear Ng, MD;  Location: [facility];  Service: Endoscopy;  Laterality: N/A;  . EYE SURGERY    . HERNIA REPAIR     inguinal  . HOT HEMOSTASIS N/A 08/11/2013   Procedure: HOT HEMOSTASIS (ARGON PLASMA COAGULATION/BICAP);  Surgeon:  Lear Ng, MD;  Location: [facility];  Service: Endoscopy;  Laterality: N/A;  . JOINT REPLACEMENT     lt hip  . ORIF WRIST FRACTURE     Social History:  reports that he has never smoked. He has never used smokeless tobacco. He reports that he does not drink alcohol or use drugs.  No Known Allergies  Family History  Problem Relation Age of Onset  . Seizures Neg Hx      Prior to Admission medications   Medication Sig Start Date End Date Taking? Authorizing Provider  amLODipine (NORVASC) 2.5 MG tablet Take 2.5 mg by mouth daily.    [provider]  aspirin 81 MG tablet Take 81 mg by mouth daily.    [provider]  atorvastatin (LIPITOR) 10 MG tablet Take 10 mg by mouth every other day.     [provider]  bimatoprost (LUMIGAN) 0.03 % ophthalmic solution Place 1 drop into both eyes at bedtime.    [provider]  brimonidine-timolol (COMBIGAN) 0.2-0.5 % ophthalmic solution Place 1 drop into both eyes every 12 (twelve) hours.    [provider]  lisinopril-hydrochlorothiazide (PRINZIDE,ZESTORETIC) 20-25 MG per tablet Take 1 tablet by mouth daily.    [provider]  methocarbamol (ROBAXIN-750) 750 MG tablet Take 1 tablet (750 mg total) by mouth 4 (four) times daily. 01/05/17   Lacretia Leigh, MD  Omega-3 Fatty Acids (FISH OIL) 1000 MG CAPS Take 1,000 mg by mouth daily.  [provider]  pioglitazone (ACTOS) 30 MG tablet Take 30 mg by mouth daily.    [provider]  pioglitazone-metformin (ACTOPLUS MET) 15-850 MG per tablet Take 1 tablet by mouth 2 (two) times daily with a meal.    [provider]   Physical Exam: Vitals:   06/12/17 0930 06/12/17 0945 06/12/17 1000 06/12/17 1015  BP: 140/73 139/71 (!) 149/73 130/65  Pulse: 68 70 70 71  Resp: _0 Temp:      TempSrc:      SpO2: 99% 98% 100% 99%  Weight:      Height:        Wt Readings from Last 3 Encounters:  06/12/17 83.9 kg  (185 lb)  01/05/17 86.2 kg (190 lb)  08/11/13 87.1 kg (192 lb)    General:  Appears calm and comfortable Eyes:  PERRL, EOMI, normal lids, iris ENT:  grossly normal hearing, lips & tongue Neck:  no LAD, masses or thyromegaly Cardiovascular:  RRR, no m/r/g. No LE edema.  Respiratory:  CTA bilaterally, no w/r/r. Normal respiratory effort. Abdomen:  soft, ntnd Skin:  no rash or induration seen on limited exam Musculoskeletal:  grossly normal tone BUE/BLE Psychiatric:  grossly normal mood and affect, speech fluent and appropriate Neurologic:  CN 2-12 grossly intact, moves all extremities in coordinated fashion.          Labs on Admission:  Basic Metabolic Panel:  Recent Labs Lab 06/12/17 0917  NA 131*  K 3.8  CL 96*  CO2 25  GLUCOSE 218*  BUN 12  CREATININE 0.68  CALCIUM 9.1   Liver Function Tests: No results for input(s): AST, ALT, ALKPHOS, BILITOT, PROT, ALBUMIN in the last 168 hours. No results for input(s): LIPASE, AMYLASE in the last 168 hours. No results for input(s): AMMONIA in the last 168 hours. CBC:  Recent Labs Lab 06/12/17 0917  WBC 10.1  HGB 14.0  HCT 39.7  MCV 86.1  PLT 204   Cardiac Enzymes:  Recent Labs Lab 06/12/17 1020  TROPONINI <0.03    BNP (last 3 results) No results for input(s): BNP in the last 8760 hours.  ProBNP (last 3 results) No results for input(s): PROBNP in the last 8760 hours.   Serum creatinine: 0.68 mg/dL 06/12/17 0917 Estimated creatinine clearance: 98.9 mL/min  CBG: No results for input(s): GLUCAP in the last 168 hours.  Radiological Exams on Admission: No results found.  EKG: Independently reviewed. First-degree AV block. No STEMI. Normal sinus rhythm.  Assessment/Plan Principal Problem:   Syncope Active Problems:   Hypertension   Glaucoma   Weakness   Syncope Check serial troponins Initial negative Place patient on telemetry Orthostatics negative Check an echo Blood sugar good on arrival We'll  consider a cardiology consult after data is back  Weakness  PT eval  Mild hyponatremia Sodium 133 corrected on admission   Glaucoma Continue Lumigan-> xalatan, Combigan  Diabetes Sliding scale insulin Hold Actos, Actos plus  Lipidemia Continue statin  Hypertension Continue Prinzide  Muscle pain Hold Robaxin  Code Status: FC  DVT Prophylaxis: SCD Family Communication: wife at bedside Disposition Plan: Pending Improvement  Status: obs tele  Elwin Mocha, MD Family Medicine [facility] [email or web address] Password [email or web address]

## 2017-06-13 DIAGNOSIS — R55 Syncope and collapse: Principal | ICD-10-CM

## 2017-06-13 DIAGNOSIS — I10 Essential (primary) hypertension: Secondary | ICD-10-CM

## 2017-06-13 LAB — CBC
HCT: 36.5 % — ABNORMAL LOW (ref 39.0–52.0)
Hemoglobin: 12.4 g/dL — ABNORMAL LOW (ref 13.0–17.0)
MCH: 29.7 pg (ref 26.0–34.0)
MCHC: 34 g/dL (ref 30.0–36.0)
MCV: 87.3 fL (ref 78.0–100.0)
PLATELETS: 184 10*3/uL (ref 150–400)
RBC: 4.18 MIL/uL — AB (ref 4.22–5.81)
RDW: 13.5 % (ref 11.5–15.5)
WBC: 5.5 10*3/uL (ref 4.0–10.5)

## 2017-06-13 LAB — GLUCOSE, CAPILLARY
GLUCOSE-CAPILLARY: 161 mg/dL — AB (ref 65–99)
Glucose-Capillary: 156 mg/dL — ABNORMAL HIGH (ref 65–99)

## 2017-06-13 LAB — BASIC METABOLIC PANEL
ANION GAP: 11 (ref 5–15)
BUN: 11 mg/dL (ref 6–20)
CALCIUM: 9 mg/dL (ref 8.9–10.3)
CO2: 25 mmol/L (ref 22–32)
Chloride: 96 mmol/L — ABNORMAL LOW (ref 101–111)
Creatinine, Ser: 0.64 mg/dL (ref 0.61–1.24)
GLUCOSE: 154 mg/dL — AB (ref 65–99)
POTASSIUM: 3.6 mmol/L (ref 3.5–5.1)
Sodium: 132 mmol/L — ABNORMAL LOW (ref 135–145)

## 2017-06-13 LAB — TROPONIN I: Troponin I: <0.03 ng/mL (ref <0.03)

## 2017-06-13 LAB — HIV ANTIBODY (ROUTINE TESTING W REFLEX): HIV Screen 4th Generation wRfx: NONREACTIVE

## 2017-06-13 MED ORDER — SODIUM CHLORIDE 0.9 % IV SOLN: INTRAVENOUS | Status: DC

## 2017-06-13 MED ORDER — MAGNESIUM SULFATE 2 GM/50ML IV SOLN
2.0000 g | Freq: Once | INTRAVENOUS | Status: AC
  Administered 2017-06-13: 2 g via INTRAVENOUS
  Filled 2017-06-13: qty 50

## 2017-06-13 NOTE — Care Management Note (Signed)
Case Management Note  Patient Details  Name: Zachary Phelps MRN: [id number] Date of Birth: [birth year]  Subjective/Objective:                 Independent patient from home in obs for syncope. No anticipated CM needs at DC.    Action/Plan:  CM will continue to follow for DC planning.  Expected Discharge Date:                  Expected Discharge Plan:  Home/Self Care  In-House Referral:     Discharge planning Services  CM Consult  Post Acute Care Choice:    Choice offered to:     DME Arranged:    DME Agency:     HH Arranged:    HH Agency:     Status of Service:  In process, will continue to follow  If discussed at Long Length of Stay Meetings, dates discussed:    Additional Comments:  Carles Collet, RN 06/13/2017, 9:42 AM

## 2017-06-13 NOTE — Progress Notes (Signed)
Patient given discharge instructions and all questions answered.  

## 2017-06-16 NOTE — Discharge Summary (Signed)
Physician Discharge Summary  Zachary Phelps WUJ:[id number] DOB: 1956/05/26 DOA: 06/12/2017  PCP: Antony Contras, MD  Admit date: 06/12/2017 Discharge date: 8/17 /2018  Admitted From: Home.  Disposition: Home.   Recommendations for Outpatient Follow-up:  1. Follow up with PCP in 1-2 weeks 2. Please obtain BMP/CBC in one week 3. If symptoms persistent, or reoccur, please follow up with cardiology in one week.    Discharge Condition:stable.  CODE STATUS: full code.  Diet recommendation: Heart Healthy  Brief/Interim Summary: Zachary Phelps is a 61 y.o. male  with past medical history of basal cell carcinoma, diabetes, glaucoma, elevated cholesterol and hypertension presented to the emergency room with syncope  Discharge Diagnoses:  Principal Problem:   Syncope Active Problems:   Hypertension   Glaucoma   Weakness  Syncope:  Unclear etiology, suspect possibly from dehydration and vasovagal syncope.  Cardiac enzymes are negative.  EKG is NSR.  Echocardiogram shows - Left ventricle: The cavity size was normal. Systolic function was   normal. The estimated ejection fraction was in the range of 55%   to 60%. Wall motion was normal; there were no regional wall   motion abnormalities.  Overnight telemetry monitoring is wnl.  If symptoms persist, recommend cardiology follow up as outpatient.   Diabetes mellitus:  CBG (last 3)   Recent Labs  06/13/17 1216  GLUCAP 156*    Resume home meds on discharge.   Hyperlipidemia:  Resume atorvastatin.    Hyponatremia and hypomagnesemia:  Gently hydrated and repleted mag.  Recommend outpatient follow up with PCP in one week.    Hypertension:  Well controlled.   Discharge Instructions  Discharge Instructions    Diet - low sodium heart healthy    Complete by:  As directed    Discharge instructions    Complete by:  As directed    PLEASE follow up with PCP in one week, follow up visit.     Allergies as of 06/13/2017   No Known  Allergies     Medication List    STOP taking these medications   methocarbamol 750 MG tablet Commonly known as:  ROBAXIN-750     TAKE these medications   amLODipine 2.5 MG tablet Commonly known as:  NORVASC Take 2.5 mg by mouth daily.   aspirin 81 MG tablet Take 81 mg by mouth daily.   atorvastatin 10 MG tablet Commonly known as:  LIPITOR Take 10 mg by mouth every other day.   bimatoprost 0.03 % ophthalmic solution Commonly known as:  LUMIGAN Place 1 drop into both eyes at bedtime.   COMBIGAN 0.2-0.5 % ophthalmic solution Generic drug:  brimonidine-timolol Place 1 drop into both eyes every 12 (twelve) hours.   Fish Oil 1000 MG Caps Take 1,000 mg by mouth daily.   lisinopril-hydrochlorothiazide 20-25 MG tablet Commonly known as:  PRINZIDE,ZESTORETIC Take 1 tablet by mouth daily.   metFORMIN 850 MG tablet Commonly known as:  GLUCOPHAGE Take 850 mg by mouth 2 (two) times daily with a meal.   pioglitazone 30 MG tablet Commonly known as:  ACTOS Take 30 mg by mouth daily.      Follow-up Information    Antony Contras, MD On 06/20/2017.   Specialty:  Family Medicine Why:  At 12:15am for hospital follow up appt.  Contact information: [place] [address] [phone]          No Known Allergies  Consultations: None.   Procedures/Studies:  No results found. Echocardiogram.  Subjective: No new complaints.   Discharge Exam: Vitals:   06/13/17 0440 06/13/17 1300  BP: 123/61 119/72  Pulse: 66 68  Resp: 18 16  Temp: 98.1 F (36.7 C) 98.3 F (36.8 C)  SpO2: 99% 100%   Vitals:   06/12/17 1959 06/13/17 0007 06/13/17 0440 06/13/17 1300  BP: 140/68 127/66 123/61 119/72  Pulse: 80 69 66 68  Resp: 18 18 18 16   Temp: 98.1 F (36.7 C) 98.5 F (36.9 C) 98.1 F (36.7 C) 98.3 F (36.8 C)  TempSrc: Oral Oral Oral Oral  SpO2: 100% 100% 99% 100%  Weight:   81.8 kg (180 lb 6.4 oz)   Height:        General: Pt is  alert, awake, not in acute distress Cardiovascular: RRR, S1/S2 +, no rubs, no gallops Respiratory: CTA bilaterally, no wheezing, no rhonchi Abdominal: Soft, NT, ND, bowel sounds + Extremities: no edema, no cyanosis    The results of significant diagnostics from this hospitalization (including imaging, microbiology, ancillary and laboratory) are listed below for reference.     Microbiology: No results found for this or any previous visit (from the past 240 hour(s)).   Labs: BNP (last 3 results) No results for input(s): BNP in the last 8760 hours. Basic Metabolic Panel:  Recent Labs Lab 06/12/17 0917 06/12/17 1245 06/13/17 0427  NA 131*  --  132*  K 3.8  --  3.6  CL 96*  --  96*  CO2 25  --  25  GLUCOSE 218*  --  154*  BUN 12  --  11  CREATININE 0.68  --  0.64  CALCIUM 9.1  --  9.0  MG  --  1.5*  --   PHOS  --  2.6  --    Liver Function Tests: No results for input(s): AST, ALT, ALKPHOS, BILITOT, PROT, ALBUMIN in the last 168 hours. No results for input(s): LIPASE, AMYLASE in the last 168 hours. No results for input(s): AMMONIA in the last 168 hours. CBC:  Recent Labs Lab 06/12/17 0917 06/13/17 0427  WBC 10.1 5.5  HGB 14.0 12.4*  HCT 39.7 36.5*  MCV 86.1 87.3  PLT 204 184   Cardiac Enzymes:  Recent Labs Lab 06/12/17 1020 06/13/17 0925  TROPONINI <0.03 <0.03   BNP: Invalid input(s): POCBNP CBG:  Recent Labs Lab 06/12/17 1718 06/12/17 2114 06/13/17 0819 06/13/17 1216  GLUCAP 216* 174* 161* 156*   D-Dimer No results for input(s): DDIMER in the last 72 hours. Hgb A1c No results for input(s): HGBA1C in the last 72 hours. Lipid Profile No results for input(s): CHOL, HDL, LDLCALC, TRIG, CHOLHDL, LDLDIRECT in the last 72 hours. Thyroid function studies No results for input(s): TSH, T4TOTAL, T3FREE, THYROIDAB in the last 72 hours.  Invalid input(s): FREET3 Anemia work up No results for input(s): VITAMINB12, FOLATE, FERRITIN, TIBC, IRON, RETICCTPCT  in the last 72 hours. Urinalysis    Component Value Date/Time   COLORURINE YELLOW 06/12/2017 1042   APPEARANCEUR HAZY (A) 06/12/2017 1042   LABSPEC 1.024 06/12/2017 1042   PHURINE 5.0 06/12/2017 1042   GLUCOSEU >=500 (A) 06/12/2017 1042   HGBUR NEGATIVE 06/12/2017 1042   BILIRUBINUR NEGATIVE 06/12/2017 1042   KETONESUR 20 (A) 06/12/2017 1042   PROTEINUR 100 (A) 06/12/2017 1042   NITRITE NEGATIVE 06/12/2017 1042   LEUKOCYTESUR NEGATIVE 06/12/2017 1042   Sepsis Labs Invalid input(s): PROCALCITONIN,  WBC,  LACTICIDVEN Microbiology No results found for this or any previous visit (from the past 240 hour(s)).  Time coordinating discharge: Over 30 minutes  SIGNED:   Hosie Poisson, MD  [facility] 06/16/2017, 10:06 AM Pager   If 7PM-7AM, please contact night-coverage [email or web address] Password [email or web address]

## 2017-07-14 ENCOUNTER — Other Ambulatory Visit: Payer: Self-pay | Admitting: Otolaryngology

## 2017-07-14 DIAGNOSIS — I10 Essential (primary) hypertension: Secondary | ICD-10-CM

## 2017-07-14 DIAGNOSIS — R55 Syncope and collapse: Secondary | ICD-10-CM

## 2017-07-14 DIAGNOSIS — G459 Transient cerebral ischemic attack, unspecified: Secondary | ICD-10-CM

## 2017-07-14 DIAGNOSIS — R42 Dizziness and giddiness: Secondary | ICD-10-CM

## 2017-07-15 ENCOUNTER — Encounter: Payer: Self-pay | Admitting: Physician Assistant

## 2017-07-15 ENCOUNTER — Ambulatory Visit (INDEPENDENT_AMBULATORY_CARE_PROVIDER_SITE_OTHER): Payer: Self-pay | Admitting: Physician Assistant

## 2017-07-15 ENCOUNTER — Encounter (INDEPENDENT_AMBULATORY_CARE_PROVIDER_SITE_OTHER): Payer: Self-pay

## 2017-07-15 VITALS — BP 160/90 | HR 78 | Ht 71.0 in | Wt 183.1 lb

## 2017-07-15 DIAGNOSIS — E785 Hyperlipidemia, unspecified: Secondary | ICD-10-CM

## 2017-07-15 DIAGNOSIS — R55 Syncope and collapse: Secondary | ICD-10-CM

## 2017-07-15 DIAGNOSIS — R072 Precordial pain: Secondary | ICD-10-CM

## 2017-07-15 DIAGNOSIS — I10 Essential (primary) hypertension: Secondary | ICD-10-CM

## 2017-07-15 MED ORDER — AMLODIPINE BESYLATE 10 MG PO TABS: 10.0000 mg | ORAL_TABLET | Freq: Every day | ORAL | 3 refills | Status: DC

## 2017-07-15 MED ORDER — AMLODIPINE BESYLATE 5 MG PO TABS: 5.0000 mg | ORAL_TABLET | Freq: Every day | ORAL | 3 refills | Status: AC

## 2017-07-15 MED ORDER — AMLODIPINE BESYLATE 5 MG PO TABS: 5.0000 mg | ORAL_TABLET | Freq: Every day | ORAL | 3 refills | Status: DC

## 2017-07-15 NOTE — Progress Notes (Signed)
Cardiology Office Note:    Date:  07/15/2017   ID:  Zachary Phelps, DOB 1955/11/23, MRN [id number]  PCP:  Antony Contras, MD  Cardiologist:  New - Dr. Casandra Doffing   Referring MD: Antony Contras, MD   Chief Complaint  Patient presents with  . Chest Pain  . Loss of Consciousness    History of Present Illness:    Zachary Phelps is a 61 y.o. male with a hx of HTN, glaucoma, HL, DM2 who is being seen today for the evaluation of chest pain at the request of Antony Contras, MD.  The patient was admitted in 8/18 with syncope thought to be secondary to dehydration and vasovagal syncope. Echocardiogram during that admission demonstrated normal LV function. Troponin levels remained negative. He saw his PCP in follow-up, posthospitalization. He continued to complain of dizziness as well as intermittent chest pain. Of note, he has recently seen otolaryngology. It was felt that his symptoms may represent a central etiology and brain MRI is currently pending.   Zachary Phelps is here alone today.  He works part time with [employer] and picks [employer].  He had worked the morning that he passed out and then went to [employer].  While he was getting his haircut, he passed out.  He did feel hot before it occurred.  He was somewhat confused when he awoke and he was diaphoretic.  He has not had a recurrence.  He notes some dizziness that was c/w vertigo as noted.  He has had chest pain for years.  He denies any exertional chest pain.  He can ride his stationary bike without symptoms. His chest pain seems to be worse when the weather is cold.  He denies shortness of breath, paroxysmal nocturnal dyspnea, edema.    PAD Screen 07/15/2017  Previous PAD dx? No  Previous surgical procedure? No  Pain with walking? No  Feet/toe relief with dangling? No  Painful, non-healing ulcers? No  Extremities discolored? No    Prior CV studies:   The following studies were reviewed today:  Echocardiogram 06/12/17 EF 55-60,  normal wall motion   Past Medical History:  Diagnosis Date  . Allergic rhinitis   . Basal cell carcinoma   . Colon polyp 08/11/2013  . Diabetes mellitus without complication ([place])   . Glaucoma   . Hx of colonic polyps   . Hypercholesteremia   . Hypertension   . Hyponatremia   . Syncope 06/12/2017   admitted 8/18 - dx with dehydration    Past Surgical History:  Procedure Laterality Date  . COLONOSCOPY    . COLONOSCOPY N/A 08/11/2013   Procedure: COLONOSCOPY;  Surgeon: Lear Ng, MD;  Location: [facility];  Service: Endoscopy;  Laterality: N/A;  . EYE SURGERY    . HERNIA REPAIR     inguinal  . HOT HEMOSTASIS N/A 08/11/2013   Procedure: HOT HEMOSTASIS (ARGON PLASMA COAGULATION/BICAP);  Surgeon: Lear Ng, MD;  Location: [facility];  Service: Endoscopy;  Laterality: N/A;  . ORIF WRIST FRACTURE    . TOTAL HIP ARTHROPLASTY Left    infected >> had 3 subsequent surgeries    Current Medications: Current Meds  Medication Sig  . aspirin 81 MG tablet Take 81 mg by mouth daily.  Marland Kitchen atorvastatin (LIPITOR) 10 MG tablet TAKE 10 MG TABLET BY MOUTH DAILY Monday THROUGH Friday EXCEPT ON WEEKENDS  . bimatoprost (LUMIGAN) 0.03 % ophthalmic solution Place 1 drop into both eyes at bedtime.  . brimonidine-timolol (COMBIGAN) 0.2-0.5 %  ophthalmic solution Place 1 drop into both eyes every 12 (twelve) hours.  . cetirizine (ZYRTEC) 10 MG tablet Take 10 mg by mouth daily as needed for allergies.  Marland Kitchen lisinopril (PRINIVIL,ZESTRIL) 20 MG tablet Take 20 mg by mouth daily.  . meclizine (ANTIVERT) 25 MG tablet Take 25 mg by mouth as needed for dizziness.  . metFORMIN (GLUCOPHAGE) 850 MG tablet Take 850 mg by mouth 2 (two) times daily with a meal.  . Omega-3 Fatty Acids (FISH OIL) 1000 MG CAPS Take 1,000 mg by mouth daily.   . pioglitazone (ACTOS) 30 MG tablet Take 30 mg by mouth daily.  . [DISCONTINUED] amLODipine (NORVASC) 2.5 MG tablet Take 2.5 mg by mouth daily.     Allergies:    Fluticasone   Social History   Social History  . Marital status: Married    Spouse name: N/A  . Number of children: N/A  . Years of education: N/A   Social History Main Topics  . Smoking status: Never Smoker  . Smokeless tobacco: Never Used  . Alcohol use No  . Drug use: No  . Sexual activity: Not Asked     Comment: MARRIED (Zachary Phelps)   Other Topics Concern  . None   Social History Narrative   Retired from [employer] after 28 years   Married    1 child - 3 grandchildren   Son is a Engineer, drilling (ortho) in [place]     Family Hx: The patient's family history includes CVA in his sister; Colon polyps in his father and mother; Dementia in his father; Diabetes in his brother, brother, brother, and father; Healthy in his son; Heart attack in his maternal grandfather and mother; Heart attack (age of onset: 49) in his brother; Heart attack (age of onset: 34) in his brother; Hypertension in his brother, father, mother, and sister; Stroke in his mother.  ROS:   Please see the history of present illness.    Review of Systems  Cardiovascular: Positive for chest pain and syncope.  Respiratory: Positive for cough.   Neurological: Positive for dizziness and headaches.   All other systems reviewed and are negative.   EKGs/Labs/Other Test Reviewed:    EKG:  EKG is not ordered today.  The ekg ordered today demonstrates n/a  Recent Labs: 06/12/2017: Magnesium 1.5 06/13/2017: BUN 11; Creatinine, Ser 0.64; Hemoglobin 12.4; Platelets 184; Potassium 3.6; Sodium 132   Recent Lipid Panel No results found for: CHOL, TRIG, HDL, CHOLHDL, LDLCALC, LDLDIRECT  Physical Exam:    VS:  BP (!) 160/90 (BP Location: Right Arm, Patient Position: Sitting, Cuff Size: Normal)   Pulse 78   Ht 5\' 11"  (1.803 m)   Wt 183 lb 1.9 oz (83.1 kg)   SpO2 98%   BMI 25.54 kg/m      Wt Readings from Last 3 Encounters:  07/15/17 183 lb 1.9 oz (83.1 kg)  06/13/17 180 lb 6.4 oz (81.8 kg)  01/05/17 190 lb (86.2  kg)     Physical Exam  Constitutional: He is oriented to person, place, and time. He appears well-developed and well-nourished. No distress.  HENT:  Head: Normocephalic and atraumatic.  Eyes: No scleral icterus.  Neck: No JVD present. Carotid bruit is not present.  Cardiovascular: Normal rate and regular rhythm.   No murmur heard. Pulmonary/Chest: Effort normal. He has no rales.  Abdominal: Soft. There is no hepatomegaly. There is no tenderness.  Musculoskeletal: He exhibits no edema.  Neurological: He is alert and oriented to person, place, and  time.  Skin: Skin is warm and dry.  Psychiatric: He has a normal mood and affect.    ASSESSMENT:    1. Precordial pain   2. Essential hypertension   3. Hyperlipidemia, unspecified hyperlipidemia type   4. Syncope, unspecified syncope type    PLAN:    In order of problems listed above:  1. Precordial pain His symptoms are s/w atypical.  His ECG when he went to the ED was normal.  However, he has several risk factors for CAD including diabetes, HTN, FHx of CAD.  He has not had a stress test in many years.    -  Arrange ETT-Myoview  2. Essential hypertension BP is above target.  He recently had his HCTZ DC'd 2/2 hyponatremia.  Continue Lisinopril.  Increase Amlodipine to 5 mg QD.   3. Hyperlipidemia, unspecified hyperlipidemia type Managed by PCP.  Continue statin.  4. Syncope, unspecified syncope type Syncopal episode sounds vasovagal.  Recent echo with normal LVF.  With dizziness concerning for central etiology, MRI is pending.     Dispo:  Return as needed w/ Dr. Irish Lack or Richardson Dopp, PA-C for worse symptoms or if stress testing is abnormal.   Medication Adjustments/Labs and Tests Ordered: Current medicines are reviewed at length with the patient today.  Concerns regarding medicines are outlined above.  Orders/Tests:  Orders Placed This Encounter  Procedures  . Myocardial Perfusion Imaging   Medication changes: Meds  ordered this encounter  Medications  . DISCONTD: amLODipine (NORVASC) 5 MG tablet    Sig: Take 1 tablet (5 mg total) by mouth daily.    Dispense:  90 tablet    Refill:  3  . DISCONTD: amLODipine (NORVASC) 10 MG tablet    Sig: Take 1 tablet (10 mg total) by mouth daily.    Dispense:  90 tablet    Refill:  3  . amLODipine (NORVASC) 5 MG tablet    Sig: Take 1 tablet (5 mg total) by mouth daily.    Dispense:  90 tablet    Refill:  3   Signed, Richardson Dopp, PA-C  07/15/2017 4:36 PM    [facility] [place], [place], [place]  [address] Phone: [phone]; Fax: [phone]   I have examined the patient and reviewed assessment and plan and discussed with patient.  Agree with above as stated.  Plan for stress test given his RF for CAD.  Larae Grooms

## 2017-07-15 NOTE — Patient Instructions (Addendum)
Medication Instructions:  1. INCREASE  AMLODIPINE TO 5 MG DAILY  Labwork: NONE ORDERED TODAY  Testing/Procedures: Your physician has requested that you have en exercise stress myoview. For further information please visit [email or web address]. Please follow instruction sheet, as given.    Follow-Up: FOLLOW UP AS NEEDED WITH DR. VARANASI PENDING TEST RESULTS   Any Other Special Instructions Will Be Listed Below (If Applicable).     If you need a refill on your cardiac medications before your next appointment, please call your pharmacy.

## 2017-07-17 ENCOUNTER — Encounter (INDEPENDENT_AMBULATORY_CARE_PROVIDER_SITE_OTHER): Payer: Self-pay

## 2017-07-17 ENCOUNTER — Encounter: Payer: Self-pay | Admitting: Physician Assistant

## 2017-07-17 ENCOUNTER — Ambulatory Visit (HOSPITAL_COMMUNITY): Payer: Self-pay | Attending: Cardiology

## 2017-07-17 ENCOUNTER — Telehealth: Payer: Self-pay | Admitting: *Deleted

## 2017-07-17 DIAGNOSIS — R072 Precordial pain: Secondary | ICD-10-CM | POA: Insufficient documentation

## 2017-07-17 LAB — MYOCARDIAL PERFUSION IMAGING
CHL CUP NUCLEAR SDS: 1
CSEPEW: 7 METS
CSEPHR: 91 %
CSEPPHR: 144 {beats}/min
Exercise duration (min): 5 min
Exercise duration (sec): 30 s
LHR: 0.27
LVDIAVOL: 115 mL (ref 62–150)
LVSYSVOL: 39 mL
MPHR: 158 {beats}/min
RPE: 18
Rest HR: 67 {beats}/min
SRS: 5
SSS: 6
TID: 0.99

## 2017-07-17 MED ORDER — TECHNETIUM TC 99M TETROFOSMIN IV KIT
31.2000 | PACK | Freq: Once | INTRAVENOUS | Status: AC | PRN
Start: 2017-07-17 — End: 2017-07-17
  Administered 2017-07-17: 31.2 via INTRAVENOUS
  Filled 2017-07-17: qty 32

## 2017-07-17 MED ORDER — TECHNETIUM TC 99M TETROFOSMIN IV KIT
10.8000 | PACK | Freq: Once | INTRAVENOUS | Status: AC | PRN
  Administered 2017-07-17: 10.8 via INTRAVENOUS
  Filled 2017-07-17: qty 11

## 2017-07-17 NOTE — Telephone Encounter (Signed)
-----   Message from Liliane Shi, [place] sent at 07/17/2017  4:45 PM EDT ----- Please call the patient. The stress test is normal. His blood pressure remains elevated. Please follow-up with PCP for further management of hypertension. Please fax a copy of this study result to his PCP:  Antony Contras, MD  Thanks! Richardson Dopp, PA-C    07/17/2017 4:44 PM

## 2017-07-17 NOTE — Telephone Encounter (Signed)
Left message to go over Myoview results.  

## 2017-07-18 NOTE — Telephone Encounter (Signed)
-----   Message from Liliane Shi, [place] sent at 07/17/2017  4:45 PM EDT ----- Please call the patient. The stress test is normal. His blood pressure remains elevated. Please follow-up with PCP for further management of hypertension. Please fax a copy of this study result to his PCP:  Antony Contras, MD  Thanks! Richardson Dopp, PA-C    07/17/2017 4:44 PM

## 2017-07-18 NOTE — Telephone Encounter (Signed)
Pt has been notified of Myoview results by phone with verbal understanding. Pt advised BP remains elevated. Pt advised of recommendation to f/u with PCP for further management of hypertension. Pt is agreeable to plan of care and thanked me for my call today. I will forward a copy of results to PCP.

## 2017-07-18 NOTE — Telephone Encounter (Signed)
F/u Message   pt returning call.

## 2017-07-24 ENCOUNTER — Ambulatory Visit
Admission: RE | Admit: 2017-07-24 | Discharge: 2017-07-24 | Disposition: A | Discharge status: Home / Self Care | Payer: No Typology Code available for payment source | Source: Ambulatory Visit | Attending: Otolaryngology | Admitting: Otolaryngology

## 2017-07-24 DIAGNOSIS — R55 Syncope and collapse: Secondary | ICD-10-CM

## 2017-07-24 DIAGNOSIS — G459 Transient cerebral ischemic attack, unspecified: Secondary | ICD-10-CM

## 2017-07-24 DIAGNOSIS — R42 Dizziness and giddiness: Secondary | ICD-10-CM

## 2017-07-24 DIAGNOSIS — I10 Essential (primary) hypertension: Secondary | ICD-10-CM

## 2017-07-24 MED ORDER — GADOBENATE DIMEGLUMINE 529 MG/ML IV SOLN
17.0000 mL | Freq: Once | INTRAVENOUS | Status: AC | PRN
  Administered 2017-07-24: 17 mL via INTRAVENOUS

## 2019-12-27 ENCOUNTER — Ambulatory Visit: Payer: Medicare Other | Attending: Internal Medicine

## 2019-12-27 DIAGNOSIS — Z23 Encounter for immunization: Secondary | ICD-10-CM | POA: Insufficient documentation

## 2019-12-27 NOTE — Progress Notes (Signed)
   Covid-19 Vaccination Clinic  Name:  Cliffton Swing    MRN: [id number] DOB: July 25, 1956  12/27/2019  Mr. Knoell was observed post Covid-19 immunization for 15 minutes without incidence. He was provided with Vaccine Information Sheet and instruction to access the V-Safe system.   Mr. Durkee was instructed to call 911 with any severe reactions post vaccine: Marland Kitchen Difficulty breathing  . Swelling of your face and throat  . A fast heartbeat  . A bad rash all over your body  . Dizziness and weakness    Immunizations Administered    Name Date Dose VIS Date Route   Pfizer COVID-19 Vaccine 12/27/2019 10:55 AM 0.3 mL 10/08/2019 Intramuscular   Manufacturer: [place]   Lot: [id number]   [place]: [id number]

## 2020-01-25 ENCOUNTER — Ambulatory Visit: Payer: Medicare Other | Attending: Internal Medicine

## 2020-01-25 DIAGNOSIS — Z23 Encounter for immunization: Secondary | ICD-10-CM

## 2020-01-25 NOTE — Progress Notes (Signed)
   Covid-19 Vaccination Clinic  Name:  Zachary Phelps    MRN: [id number] DOB: 12/22/1955  01/25/2020  [name] was observed post Covid-19 immunization for 15 minutes without incident. He was provided with Vaccine Information Sheet and instruction to access the V-Safe system.   [name] was instructed to call 911 with any severe reactions post vaccine: Marland Kitchen Difficulty breathing  . Swelling of face and throat  . A fast heartbeat  . A bad rash all over body  . Dizziness and weakness   Immunizations Administered    Name Date Dose VIS Date Route   Pfizer COVID-19 Vaccine 01/25/2020 11:07 AM 0.3 mL 10/08/2019 Intramuscular   Manufacturer: [place]   Lot: [id number]   [place]: [id number]

## 2020-02-14 ENCOUNTER — Other Ambulatory Visit: Payer: Self-pay

## 2020-02-14 ENCOUNTER — Encounter: Payer: Self-pay | Admitting: Emergency Medicine

## 2020-02-14 ENCOUNTER — Ambulatory Visit
Admission: EM | Admit: 2020-02-14 | Discharge: 2020-02-14 | Disposition: A | Discharge status: Home / Self Care | Payer: Medicare Other

## 2020-02-14 DIAGNOSIS — M545 Low back pain, unspecified: Secondary | ICD-10-CM

## 2020-02-14 MED ORDER — TIZANIDINE HCL 2 MG PO TABS: 2.0000 mg | ORAL_TABLET | Freq: Four times a day (QID) | ORAL | 0 refills | Status: AC | PRN

## 2020-02-14 MED ORDER — PREDNISONE 10 MG (21) PO TBPK: ORAL_TABLET | Freq: Every day | ORAL | 0 refills | Status: AC

## 2020-02-14 NOTE — ED Provider Notes (Signed)
[facility]    CSN: [id number] Arrival date & time: 02/14/20  1429      History   Chief Complaint Chief Complaint  Patient presents with  . Back Pain    HPI Zachary Phelps is a 64 y.o. male with history of hypertension and left hip replacement presenting for right low back pain.  States this has been ongoing for the last 2 weeks.  Intermittent and worse with certain positions.  Patient played kickball around that time: No injury, though did fall.  No head trauma, LOC.  Patient has been doing a lot of work at home and under the house (lots of bending, lifting).  Patient denies pop/snap/tearing sensation, right leg pain or weakness.  Tried ibuprofen with some relief.  States he took it easy over this weekend and "felt great", though upon returning to work today had recurrence.   Past Medical History:  Diagnosis Date  . Allergic rhinitis   . Basal cell carcinoma   . Colon polyp 08/11/2013  . Diabetes mellitus without complication ([place])   . Glaucoma   . History of nuclear stress test    Myoview 9/18: EF 66, no ischemia or scar, low risk  . Hx of colonic polyps   . Hypercholesteremia   . Hypertension   . Hyponatremia   . Syncope 06/12/2017   admitted 8/18 - dx with dehydration    Patient Active Problem List   Diagnosis Date Noted  . Syncope 06/12/2017  . Weakness 06/12/2017  . Hypertension   . Glaucoma   . Benign neoplasm of colon 08/11/2013    Past Surgical History:  Procedure Laterality Date  . COLONOSCOPY    . COLONOSCOPY N/A 08/11/2013   Procedure: COLONOSCOPY;  Surgeon: Lear Ng, MD;  Location: [facility];  Service: Endoscopy;  Laterality: N/A;  . EYE SURGERY    . HERNIA REPAIR     inguinal  . HOT HEMOSTASIS N/A 08/11/2013   Procedure: HOT HEMOSTASIS (ARGON PLASMA COAGULATION/BICAP);  Surgeon: Lear Ng, MD;  Location: [facility];  Service: Endoscopy;  Laterality: N/A;  . ORIF WRIST FRACTURE    . TOTAL HIP ARTHROPLASTY Left     infected >> had 3 subsequent surgeries       Home Medications    Prior to Admission medications   Medication Sig Start Date End Date Taking? Authorizing Provider  amLODipine (NORVASC) 5 MG tablet Take 1 tablet (5 mg total) by mouth daily. 07/15/17 10/13/17  Richardson Dopp T, PA-C  aspirin 81 MG tablet Take 81 mg by mouth daily.    [provider]  atorvastatin (LIPITOR) 10 MG tablet TAKE 10 MG TABLET BY MOUTH DAILY Monday THROUGH Friday EXCEPT ON WEEKENDS    [provider]  bimatoprost (LUMIGAN) 0.03 % ophthalmic solution Place 1 drop into both eyes at bedtime.    [provider]  brimonidine-timolol (COMBIGAN) 0.2-0.5 % ophthalmic solution Place 1 drop into both eyes every 12 (twelve) hours.    [provider]  cetirizine (ZYRTEC) 10 MG tablet Take 10 mg by mouth daily as needed for allergies.    [provider]  insulin aspart protamine - aspart (NOVOLOG 70/30 MIX) (70-30) 100 UNIT/ML FlexPen Inject into the skin.    [provider]  lisinopril (PRINIVIL,ZESTRIL) 20 MG tablet Take 20 mg by mouth daily.    [provider]  meclizine (ANTIVERT) 25 MG tablet Take 25 mg by mouth as needed for dizziness.    [provider]  metFORMIN (  GLUCOPHAGE) 850 MG tablet Take 850 mg by mouth 2 (two) times daily with a meal.    [provider]  Omega-3 Fatty Acids (FISH OIL) 1000 MG CAPS Take 1,000 mg by mouth daily.     [provider]  pioglitazone (ACTOS) 30 MG tablet Take 30 mg by mouth daily.    [provider]  predniSONE (STERAPRED UNI-PAK 21 TAB) 10 MG (21) TBPK tablet Take by mouth daily. Take steroid taper as written 02/14/20   Hall-Potvin, [place], PA-C  tiZANidine (ZANAFLEX) 2 MG tablet Take 1 tablet (2 mg total) by mouth every 6 (six) hours as needed for muscle spasms. 02/14/20   Hall-Potvin, [place], PA-C    Family History Family History  Problem Relation Age of Onset  . Hypertension  Mother   . Colon polyps Mother   . Heart attack Mother        died ag 70  . Stroke Mother   . Hypertension Father   . Diabetes Father   . Dementia Father   . Colon polyps Father   . Hypertension Sister   . CVA Sister   . Hypertension Brother   . Heart attack Brother 90  . Diabetes Brother   . Diabetes Brother   . Heart attack Brother 58  . Diabetes Brother   . Healthy Son   . Heart attack Maternal Grandfather     Social History Social History   Tobacco Use  . Smoking status: Never Smoker  . Smokeless tobacco: Never Used  Substance Use Topics  . Alcohol use: No  . Drug use: No     Allergies   Fluticasone   Review of Systems As per HPI   Physical Exam Triage Vital Signs ED Triage Vitals  Enc Vitals Group     BP      Pulse      Resp      Temp      Temp src      SpO2      Weight      Height      Head Circumference      Peak Flow      Pain Score      Pain Loc      Pain Edu?      Excl. in [place]?    No data found.  Updated Vital Signs BP (!) 165/76 (BP Location: Left Arm)   Pulse 70   Temp 98 F (36.7 C) (Oral)   Resp 18   SpO2 98%   Visual Acuity Right Eye Distance:   Left Eye Distance:   Bilateral Distance:    Right Eye Near:   Left Eye Near:    Bilateral Near:     Physical Exam Constitutional:      General: He is not in acute distress. HENT:     Head: Normocephalic and atraumatic.  Eyes:     General: No scleral icterus.    Pupils: Pupils are equal, round, and reactive to light.  Cardiovascular:     Rate and Rhythm: Normal rate.  Pulmonary:     Effort: Pulmonary effort is normal. No respiratory distress.     Breath sounds: No wheezing.  Musculoskeletal:        General: Tenderness present. No swelling. Normal range of motion.     Right hip: Normal.     Left hip: Normal.     Right knee: Normal.     Left knee: Normal.     Right ankle: Normal.  Left ankle: Normal.     Comments: Mild tenderness without crepitus, edema, spasm of  right lumbar paraspinals.  No spinous process, PSIS tenderness bilaterally  Skin:    Coloration: Skin is not jaundiced or pale.  Neurological:     Mental Status: He is alert and oriented to person, place, and time.     Sensory: No sensory deficit.     Gait: Gait normal.     Deep Tendon Reflexes: Reflexes normal.      UC Treatments / Results  Labs (all labs ordered are listed, but only abnormal results are displayed) Labs Reviewed - No data to display  EKG   Radiology No results found.  Procedures Procedures (including critical care time)  Medications Ordered in UC Medications - No data to display  Initial Impression / Assessment and Plan / UC Course  I have reviewed the triage vital signs and the nursing notes.  Pertinent labs & imaging results that were available during my care of the patient were reviewed by me and considered in my medical decision making (see chart for details).     Patient febrile, nontoxic, and without injury or red flags such as saddle area anesthesia, urinary retention or fecal incontinence.  Will trial supportive treatment, then have patient follow-up with his orthopedics, whom he knows well, if needed for persistent or worsening symptoms.  Return precautions discussed, patient verbalized understanding and is agreeable to plan. Final Clinical Impressions(s) / UC Diagnoses   Final diagnoses:  Acute right-sided low back pain without sciatica     Discharge Instructions     Recommend RICE: rest, ice, compression, elevation as needed for pain.    Heat therapy (hot compress, warm wash rag, hot showers, etc.) can help relax muscles and soothe muscle aches. Cold therapy (ice packs) can be used to help swelling both after injury and after prolonged use of areas of chronic pain/aches.  For pain: steroid taper as directed.  6-5-4-3-2-1 May take tylenol only if needed.  May take muscle relaxer as needed for severe pain / spasm.  (This medication may  cause you to become tired so it is important you do not drink alcohol or operate heavy machinery while on this medication.  Recommend your first dose to be taken before bedtime to monitor for side effects safely)  Return for any pain, lower extremity numbness or weakness, changes in bowel or bladder habit, fever.    ED Prescriptions    Medication Sig Dispense Auth. Provider   predniSONE (STERAPRED UNI-PAK 21 TAB) 10 MG (21) TBPK tablet Take by mouth daily. Take steroid taper as written 21 tablet Hall-Potvin, [place], PA-C   tiZANidine (ZANAFLEX) 2 MG tablet Take 1 tablet (2 mg total) by mouth every 6 (six) hours as needed for muscle spasms. 12 tablet Hall-Potvin, [place], PA-C     I have reviewed the PDMP during this encounter.   Hall-Potvin, [place], [place] 02/14/20 1607

## 2020-02-14 NOTE — ED Triage Notes (Addendum)
Back pain for 2 weeks.  Patient played kick ball 2 weeks ago, but no known injury.  Since then tripped, but no fall.  Pain in right lower back and right leg  Patient also describes doing a lot of work under the house  Thursday start vacation and looking for something for pain   Patient has had both vaccines

## 2020-02-14 NOTE — Discharge Instructions (Addendum)
Recommend RICE: rest, ice, compression, elevation as needed for pain.    Heat therapy (hot compress, warm wash rag, hot showers, etc.) can help relax muscles and soothe muscle aches. Cold therapy (ice packs) can be used to help swelling both after injury and after prolonged use of areas of chronic pain/aches.  For pain: steroid taper as directed.  6-5-4-3-2-1 May take tylenol only if needed.  May take muscle relaxer as needed for severe pain / spasm.  (This medication may cause you to become tired so it is important you do not drink alcohol or operate heavy machinery while on this medication.  Recommend your first dose to be taken before bedtime to monitor for side effects safely)  Return for any pain, lower extremity numbness or weakness, changes in bowel or bladder habit, fever.
